# Patient Record
Sex: Male | Born: 1965 | Race: Black or African American | Hispanic: No | Smoking: Current some day smoker
Health system: Southern US, Community
[De-identification: ages and names within clinical notes are randomized; demographics above are authoritative.]

## PROBLEM LIST (undated history)

## (undated) ENCOUNTER — Emergency Department (HOSPITAL_COMMUNITY): Disposition: A | Discharge status: Home / Self Care | Payer: BLUE CROSS/BLUE SHIELD

## (undated) DIAGNOSIS — M5430 Sciatica, unspecified side: Secondary | ICD-10-CM

## (undated) HISTORY — PX: LEG SURGERY: SHX1003

---

## 2000-08-30 ENCOUNTER — Emergency Department (HOSPITAL_COMMUNITY): Admission: EM | Admit: 2000-08-30 | Discharge: 2000-08-30 | Payer: Self-pay | Admitting: Emergency Medicine

## 2002-06-21 ENCOUNTER — Encounter: Payer: Self-pay | Admitting: Emergency Medicine

## 2002-06-21 ENCOUNTER — Emergency Department (HOSPITAL_COMMUNITY): Admission: EM | Admit: 2002-06-21 | Discharge: 2002-06-21 | Payer: Self-pay | Admitting: Emergency Medicine

## 2002-09-04 ENCOUNTER — Encounter: Payer: Self-pay | Admitting: Emergency Medicine

## 2002-09-04 ENCOUNTER — Emergency Department (HOSPITAL_COMMUNITY): Admission: EM | Admit: 2002-09-04 | Discharge: 2002-09-04 | Payer: Self-pay | Admitting: Emergency Medicine

## 2005-04-30 ENCOUNTER — Emergency Department (HOSPITAL_COMMUNITY): Admission: EM | Admit: 2005-04-30 | Discharge: 2005-04-30 | Payer: Self-pay | Admitting: Family Medicine

## 2005-07-08 ENCOUNTER — Emergency Department (HOSPITAL_COMMUNITY): Admission: EM | Admit: 2005-07-08 | Discharge: 2005-07-08 | Payer: Self-pay | Admitting: Emergency Medicine

## 2006-12-12 ENCOUNTER — Emergency Department (HOSPITAL_COMMUNITY): Admission: EM | Admit: 2006-12-12 | Discharge: 2006-12-12 | Payer: Self-pay | Admitting: Family Medicine

## 2007-05-11 ENCOUNTER — Emergency Department (HOSPITAL_COMMUNITY): Admission: EM | Admit: 2007-05-11 | Discharge: 2007-05-11 | Payer: Self-pay | Admitting: Family Medicine

## 2007-05-26 ENCOUNTER — Emergency Department (HOSPITAL_COMMUNITY): Admission: EM | Admit: 2007-05-26 | Discharge: 2007-05-26 | Payer: Self-pay | Admitting: Family Medicine

## 2011-08-17 LAB — HERPES SIMPLEX VIRUS CULTURE: Culture: NOT DETECTED

## 2011-08-17 LAB — HIV ANTIBODY (ROUTINE TESTING W REFLEX): HIV: NONREACTIVE

## 2011-08-17 LAB — GC/CHLAMYDIA PROBE AMP, GENITAL
Chlamydia, DNA Probe: NEGATIVE
GC Probe Amp, Genital: NEGATIVE

## 2011-08-17 LAB — RPR: RPR Ser Ql: NONREACTIVE

## 2011-08-17 LAB — HSV 2 ANTIBODY, IGG: HSV 2 Glycoprotein G Ab, IgG: 0.07

## 2011-10-06 ENCOUNTER — Encounter: Payer: Self-pay | Admitting: Emergency Medicine

## 2011-10-06 ENCOUNTER — Emergency Department (INDEPENDENT_AMBULATORY_CARE_PROVIDER_SITE_OTHER)
Admission: EM | Admit: 2011-10-06 | Discharge: 2011-10-06 | Disposition: A | Discharge status: Home / Self Care | Payer: Self-pay | Source: Home / Self Care | Attending: Family Medicine | Admitting: Family Medicine

## 2011-10-06 ENCOUNTER — Emergency Department (INDEPENDENT_AMBULATORY_CARE_PROVIDER_SITE_OTHER): Payer: Self-pay

## 2011-10-06 DIAGNOSIS — J40 Bronchitis, not specified as acute or chronic: Secondary | ICD-10-CM

## 2011-10-06 MED ORDER — ALBUTEROL SULFATE HFA 108 (90 BASE) MCG/ACT IN AERS: 2.0000 | INHALATION_SPRAY | RESPIRATORY_TRACT | Status: DC | PRN

## 2011-10-06 MED ORDER — GUAIFENESIN-CODEINE 100-10 MG/5ML PO SYRP: 5.0000 mL | ORAL_SOLUTION | Freq: Four times a day (QID) | ORAL | Status: AC | PRN

## 2011-10-06 NOTE — ED Provider Notes (Signed)
History     CSN: [id number] Arrival date & time: 10/06/2011  9:51 AM   First MD Initiated Contact with Patient 10/06/11 1000      No chief complaint on file.   (Consider location/radiation/quality/duration/timing/severity/associated sxs/prior treatment) HPI Comments: Glen Morton presents for evaluation of productive cough, fever, chills, back pain since Sunday morning. He also reports dizziness.  Patient is a 45 y.o. male presenting with cough. The history is provided by the patient.  Cough This is a new problem. The current episode started more than 2 days ago. The problem occurs constantly. The cough is productive of sputum. The maximum temperature recorded prior to his arrival was 101 to 101.9 F. The fever has been present for 1 to 2 days. Associated symptoms include chest pain, chills, sweats and shortness of breath. He has tried cough syrup for the symptoms. The treatment provided no relief.    No past medical history on file.  No past surgical history on file.  No family history on file.  History  Substance Use Topics  . Smoking status: Not on file  . Smokeless tobacco: Not on file  . Alcohol Use: Not on file      Review of Systems  Constitutional: Positive for chills.  Eyes: Negative.   Respiratory: Positive for cough and shortness of breath.   Cardiovascular: Positive for chest pain.  Gastrointestinal: Negative.   Genitourinary: Negative.   Musculoskeletal: Negative.   Skin: Negative.   Neurological: Positive for dizziness.    Allergies  Review of patient's allergies indicates not on file.  Home Medications  No current outpatient prescriptions on file.  BP 125/81  Pulse 84  Temp(Src) 97.6 F (36.4 C) (Oral)  Resp 12  SpO2 100%  Physical Exam  Nursing note and vitals reviewed. Constitutional: He is oriented to person, place, and time. He appears well-developed and well-nourished.  HENT:  Head: Normocephalic and atraumatic.  Right Ear: Tympanic  membrane and external ear normal.  Left Ear: Tympanic membrane and external ear normal.  Mouth/Throat: Uvula is midline, oropharynx is clear and moist and mucous membranes are normal. No oropharyngeal exudate, posterior oropharyngeal edema or posterior oropharyngeal erythema.  Eyes: Conjunctivae and EOM are normal. Pupils are equal, round, and reactive to light.  Neck: Normal range of motion.  Cardiovascular: Normal rate and regular rhythm.   Pulmonary/Chest: Effort normal. He has no wheezes. He has rhonchi in the left middle field and the left lower field. He has no rales.  Neurological: He is alert and oriented to person, place, and time.  Skin: Skin is warm and dry.    ED Course  Procedures (including critical care time)  Labs Reviewed - No data to display No results found.   No diagnosis found.    MDM  Chest x-ray: no infiltrate        Richardo Priest, MD 10/06/11 1121

## 2011-10-06 NOTE — ED Notes (Signed)
See previous note

## 2012-04-27 ENCOUNTER — Emergency Department (HOSPITAL_COMMUNITY)
Admission: EM | Admit: 2012-04-27 | Discharge: 2012-04-27 | Disposition: A | Discharge status: Home / Self Care | Payer: Self-pay | Attending: Emergency Medicine | Admitting: Emergency Medicine

## 2012-04-27 DIAGNOSIS — R209 Unspecified disturbances of skin sensation: Secondary | ICD-10-CM | POA: Insufficient documentation

## 2012-04-27 DIAGNOSIS — R202 Paresthesia of skin: Secondary | ICD-10-CM

## 2012-04-27 MED ORDER — PREDNISONE 20 MG PO TABS: 60.0000 mg | ORAL_TABLET | Freq: Every day | ORAL | Status: AC

## 2012-04-27 NOTE — Discharge Instructions (Signed)
Take prednisone as prescribed and follow up with [facility] for further evaluation and outpatient testing. Return here with any worsening symptoms - pain, weakness of the leg or foot, swelling, fever.

## 2012-04-27 NOTE — ED Provider Notes (Signed)
History     CSN: [id number]  Arrival date & time 04/27/12  0806   First MD Initiated Contact with Patient 04/27/12 0813      No chief complaint on file.   (Consider location/radiation/quality/duration/timing/severity/associated sxs/prior treatment) Patient is a 46 y.o. male presenting with lower extremity pain. The history is provided by the patient.  Foot Pain This is a new problem. The current episode started in the past 7 days. The problem occurs constantly. The problem has been unchanged. Associated symptoms include numbness. Pertinent negatives include no fever or weakness. Associated symptoms comments: He reports numbness and tingling to medial right foot without pain, swelling, discoloration or injury. There is no weakness. No history of similar symptoms previously.. Nothing aggravates the symptoms.    No past medical history on file.  Past Surgical History  Procedure Date  . Leg surgery     No family history on file.  History  Substance Use Topics  . Smoking status: Former Games developer  . Smokeless tobacco: Not on file  . Alcohol Use: No      Review of Systems  Constitutional: Negative for fever.  Cardiovascular: Negative for leg swelling.  Musculoskeletal: Negative for back pain.  Neurological: Positive for numbness. Negative for weakness.    Allergies  Review of patient's allergies indicates no known allergies.  Home Medications   Current Outpatient Rx  Name Route Sig Dispense Refill  . VITAMIN C PO Oral Take 1 tablet by mouth daily.    . B-12 PO Oral Take 1 tablet by mouth daily.    Marland Kitchen ZINC PO Oral Take 1 tablet by mouth daily.      BP 120/80  Pulse 70  Temp 97.9 F (36.6 C) (Oral)  Resp 18  SpO2 100%  Physical Exam  Constitutional: He is oriented to person, place, and time. He appears well-developed and well-nourished. No distress.  Musculoskeletal: Normal range of motion.       No back tenderness.  Neurological: He is alert and oriented to  person, place, and time. He has normal reflexes.       Decreased sensation to light touch medial aspect right foot from mid-forefoot to distal lower leg. No swelling, discoloration, or tenderness. Pulses 2 +. Full strength without deficit of knee, ankle and all digits equally.  Skin: Skin is warm and dry. No erythema.    ED Course  Procedures (including critical care time)  Labs Reviewed - No data to display No results found.   No diagnosis found.  1. Paresthesias   MDM  No objective physical exam findings, subjective sensation changes only. Will use anti-inflammatories and refer to neurology to consider nerve conduction studies.        Rodena Medin, PA-C 04/27/12 [phone]

## 2012-04-28 NOTE — ED Provider Notes (Signed)
Medical screening examination/treatment/procedure(s) were performed by non-physician practitioner and as supervising physician I was immediately available for consultation/collaboration.   Bereket Gernert L Noela Brothers, MD 04/28/12 1926 

## 2013-05-12 ENCOUNTER — Encounter (HOSPITAL_COMMUNITY): Payer: Self-pay

## 2013-05-12 ENCOUNTER — Emergency Department (HOSPITAL_COMMUNITY)
Admission: EM | Admit: 2013-05-12 | Discharge: 2013-05-12 | Disposition: A | Discharge status: Home / Self Care | Payer: Self-pay | Attending: Emergency Medicine | Admitting: Emergency Medicine

## 2013-05-12 DIAGNOSIS — Y929 Unspecified place or not applicable: Secondary | ICD-10-CM | POA: Insufficient documentation

## 2013-05-12 DIAGNOSIS — Y9389 Activity, other specified: Secondary | ICD-10-CM | POA: Insufficient documentation

## 2013-05-12 DIAGNOSIS — Z87891 Personal history of nicotine dependence: Secondary | ICD-10-CM | POA: Insufficient documentation

## 2013-05-12 DIAGNOSIS — S39012A Strain of muscle, fascia and tendon of lower back, initial encounter: Secondary | ICD-10-CM

## 2013-05-12 DIAGNOSIS — S335XXA Sprain of ligaments of lumbar spine, initial encounter: Secondary | ICD-10-CM | POA: Insufficient documentation

## 2013-05-12 DIAGNOSIS — X503XXA Overexertion from repetitive movements, initial encounter: Secondary | ICD-10-CM | POA: Insufficient documentation

## 2013-05-12 DIAGNOSIS — Z79899 Other long term (current) drug therapy: Secondary | ICD-10-CM | POA: Insufficient documentation

## 2013-05-12 MED ORDER — HYDROCODONE-ACETAMINOPHEN 5-325 MG PO TABS
1.0000 | ORAL_TABLET | Freq: Once | ORAL | Status: AC
  Administered 2013-05-12: 1 via ORAL
  Filled 2013-05-12: qty 1

## 2013-05-12 MED ORDER — HYDROCODONE-ACETAMINOPHEN 5-325 MG PO TABS
1.0000 | ORAL_TABLET | Freq: Four times a day (QID) | ORAL | Status: DC | PRN
Start: 2013-05-12 — End: 2014-02-10

## 2013-05-12 MED ORDER — PREDNISONE 10 MG PO TABS: 20.0000 mg | ORAL_TABLET | Freq: Every day | ORAL | Status: DC

## 2013-05-12 MED ORDER — CYCLOBENZAPRINE HCL 10 MG PO TABS: 10.0000 mg | ORAL_TABLET | Freq: Two times a day (BID) | ORAL | Status: DC | PRN

## 2013-05-12 NOTE — ED Notes (Signed)
Pt presents with 1 week h/o low back pain.  Pt reports pain began after moving a transformer, with pain increasing today when he had to run from a dog.  Pt reports pain radiates down R leg, pt denies any urinary or fecal incontinence.

## 2013-05-12 NOTE — ED Provider Notes (Signed)
History    CSN: [id number] Arrival date & time 05/12/13  1137  First MD Initiated Contact with Patient 05/12/13 1245     No chief complaint on file.  (Consider location/radiation/quality/duration/timing/severity/associated sxs/prior Treatment) HPI  Glen Morton is a 47 y.o.male without any significant PMH presents to the ER with complaints of right-sided low back pain. It started one week ago after he was moving something heavy and never went away. Today he was chased by a dog and afterwards his pain is significantly worse. He says he does not want to stand up straight because of the pain. It is non-the midline but it radiates down his right leg. He has not had any bowel or urine incontinence. Says he feels some tingling in his thigh. He did not fall and has not had any other associated symptoms with it.  History reviewed. No pertinent past medical history. Past Surgical History  Procedure Laterality Date  . Leg surgery     History reviewed. No pertinent family history. History  Substance Use Topics  . Smoking status: Former Games developer  . Smokeless tobacco: Not on file  . Alcohol Use: No    Review of Systems  Musculoskeletal: Positive for back pain.  All other systems reviewed and are negative.    Allergies  Review of patient's allergies indicates no known allergies.  Home Medications   Current Outpatient Rx  Name  Route  Sig  Dispense  Refill  . Acetaminophen (TYLENOL PO)   Oral   Take 2 tablets by mouth once.         . cyclobenzaprine (FLEXERIL) 10 MG tablet   Oral   Take 1 tablet (10 mg total) by mouth 2 (two) times daily as needed for muscle spasms.   20 tablet   0   . HYDROcodone-acetaminophen (NORCO/VICODIN) 5-325 MG per tablet   Oral   Take 1 tablet by mouth every 6 (six) hours as needed for pain.   20 tablet   0   . predniSONE (DELTASONE) 10 MG tablet   Oral   Take 2 tablets (20 mg total) by mouth daily.   15 tablet   0    BP 129/77  Pulse 80   Temp(Src) 98.6 F (37 C) (Oral)  Resp 96  SpO2 97% Physical Exam  Nursing note and vitals reviewed. Constitutional: He appears well-developed and well-nourished. No distress.  HENT:  Head: Normocephalic and atraumatic.  Eyes: Pupils are equal, round, and reactive to light.  Neck: Normal range of motion. Neck supple.  Cardiovascular: Normal rate and regular rhythm.   Pulmonary/Chest: Effort normal.  Abdominal: Soft.  Musculoskeletal:       Back:   Equal strength to bilateral lower extremities. Neurosensory function adequate to both legs. Skin color is normal. Skin is warm and moist. I see no step off deformity, no bony tenderness. Pt is able to ambulate without limp. Pain is relieved when sitting in certain positions. ROM is decreased due to pain. No crepitus, laceration, effusion, swelling.  Pulses are normal   Neurological: He is alert.  Skin: Skin is warm and dry.    ED Course  Procedures (including critical care time) Labs Reviewed - No data to display No results found. 1. Back strain, initial encounter     MDM  Patient with back pain. No neurological deficits. Patient is ambulatory. No warning symptoms of back pain including: loss of bowel or bladder control, night sweats, waking from sleep with back pain, unexplained fevers or weight loss,  h/o cancer, IVDU, recent significant  trauma. No concern for cauda equina, epidural abscess, or other serious cause of back pain. Conservative measures such as rest, ice/heat and pain medicine indicated with PCP follow-up if no improvement with conservative management.     Dorthula Matas, PA-C 05/12/13 1314

## 2013-05-16 NOTE — ED Provider Notes (Signed)
Medical screening examination/treatment/procedure(s) were performed by non-physician practitioner and as supervising physician I was immediately available for consultation/collaboration.    Gilda Crease, MD 05/16/13 [phone]

## 2014-02-10 ENCOUNTER — Encounter (HOSPITAL_COMMUNITY): Payer: Self-pay | Admitting: Emergency Medicine

## 2014-02-10 ENCOUNTER — Emergency Department (INDEPENDENT_AMBULATORY_CARE_PROVIDER_SITE_OTHER)
Admission: EM | Admit: 2014-02-10 | Discharge: 2014-02-10 | Disposition: A | Discharge status: Home / Self Care | Payer: Self-pay | Source: Home / Self Care | Attending: Family Medicine | Admitting: Family Medicine

## 2014-02-10 DIAGNOSIS — K0889 Other specified disorders of teeth and supporting structures: Secondary | ICD-10-CM

## 2014-02-10 DIAGNOSIS — K089 Disorder of teeth and supporting structures, unspecified: Secondary | ICD-10-CM

## 2014-02-10 MED ORDER — TRAMADOL HCL 50 MG PO TABS: 50.0000 mg | ORAL_TABLET | Freq: Four times a day (QID) | ORAL | Status: DC | PRN

## 2014-02-10 MED ORDER — AMOXICILLIN 500 MG PO CAPS: 500.0000 mg | ORAL_CAPSULE | Freq: Three times a day (TID) | ORAL | Status: DC

## 2014-02-10 NOTE — ED Provider Notes (Signed)
Glen Morton is a 48 y.o. male who presents to Urgent Care today for  Left ear and teeth pain. Patient has a five-day history of left upper tooth pain radiating to the left ear. He applied a salt compresses which seem to help his cheek some. He denies any fevers chills nausea vomiting or diarrhea.   History reviewed. No pertinent past medical history. History  Substance Use Topics  . Smoking status: Current Some Day Smoker    Types: Cigarettes  . Smokeless tobacco: Not on file  . Alcohol Use: No   ROS as above Medications: No current facility-administered medications for this encounter.   Current Outpatient Prescriptions  Medication Sig Dispense Refill  . Acetaminophen (TYLENOL PO) Take 2 tablets by mouth once.      Marland Kitchen. amoxicillin (AMOXIL) 500 MG capsule Take 1 capsule (500 mg total) by mouth 3 (three) times daily.  30 capsule  0  . traMADol (ULTRAM) 50 MG tablet Take 1 tablet (50 mg total) by mouth every 6 (six) hours as needed.  15 tablet  0    Exam:  BP 128/82  Pulse 66  Temp(Src) 98.1 F (36.7 C) (Oral)  Resp 14  SpO2 100% Gen: Well NAD HEENT: EOMI,  MMM upper left wisdom tooth eroded to gum with gumline erythema and tenderness. Tympanic membranes are normal appearing bilaterally Lungs: Normal work of breathing. CTABL Heart: RRR no MRG Abd: NABS, Soft. NT, ND Exts: Brisk capillary refill, warm and well perfused.    Assessment and Plan: 48 y.o. male with dental pain radiating to the left ear. Secondary to infection. Plan to treat with amoxicillin and tramadol. Refer to dentist.  Discussed warning signs or symptoms. Please see discharge instructions. Patient expresses understanding.    Rodolph BongEvan S Elsworth Ledin, MD 02/10/14 1248

## 2014-02-10 NOTE — Discharge Instructions (Signed)
Thank you for coming in today. Take amoxicillin 3 times daily Take 2 Aleve twice daily for pain Use tramadol for severe pain Followup with a dentist as soon as possible  Please call Dr. Lawrence Marseillesivils office [phone] or cell [phone] [address], [place]  Cost for tooth removal $200 includes exam, Xray, and extraction and follow up visit.  Bring list of current medications with you.   Please call [facility] at [phone] to get the details to get your tooth pulled.    Dental Pain A tooth ache may be caused by cavities (tooth decay). Cavities expose the nerve of the tooth to air and hot or cold temperatures. It may come from an infection or abscess (also called a boil or furuncle) around your tooth. It is also often caused by dental caries (tooth decay). This causes the pain you are having. DIAGNOSIS  Your caregiver can diagnose this problem by exam. TREATMENT   If caused by an infection, it may be treated with medications which kill germs (antibiotics) and pain medications as prescribed by your caregiver. Take medications as directed.  Only take over-the-counter or prescription medicines for pain, discomfort, or fever as directed by your caregiver.  Whether the tooth ache today is caused by infection or dental disease, you should see your dentist as soon as possible for further care. SEEK MEDICAL CARE IF: The exam and treatment you received today has been provided on an emergency basis only. This is not a substitute for complete medical or dental care. If your problem worsens or new problems (symptoms) appear, and you are unable to meet with your dentist, call or return to this location. SEEK IMMEDIATE MEDICAL CARE IF:   You have a fever.  You develop redness and swelling of your face, jaw, or neck.  You are unable to open your mouth.  You have severe pain uncontrolled by pain medicine. MAKE SURE YOU:   Understand these instructions.  Will watch your  condition.  Will get help right away if you are not doing well or get worse. Document Released: 10/19/2005 Document Revised: 01/11/2012 Document Reviewed: 06/06/2008 [facility] Patient Information 2014 [employer] [place].

## 2014-02-10 NOTE — ED Notes (Signed)
Pt had an upper left toothache about a week ago, not that pain has resolved.  The next day --2 days ago-- his left ear started hurting.  He denies discharge

## 2014-03-17 ENCOUNTER — Emergency Department (HOSPITAL_COMMUNITY): Payer: Self-pay

## 2014-03-17 ENCOUNTER — Emergency Department (HOSPITAL_COMMUNITY)
Admission: EM | Admit: 2014-03-17 | Discharge: 2014-03-17 | Disposition: A | Discharge status: Home / Self Care | Payer: Self-pay | Attending: Emergency Medicine | Admitting: Emergency Medicine

## 2014-03-17 ENCOUNTER — Encounter (HOSPITAL_COMMUNITY): Payer: Self-pay | Admitting: Emergency Medicine

## 2014-03-17 DIAGNOSIS — F17200 Nicotine dependence, unspecified, uncomplicated: Secondary | ICD-10-CM | POA: Insufficient documentation

## 2014-03-17 DIAGNOSIS — M25569 Pain in unspecified knee: Secondary | ICD-10-CM | POA: Insufficient documentation

## 2014-03-17 DIAGNOSIS — Z792 Long term (current) use of antibiotics: Secondary | ICD-10-CM | POA: Insufficient documentation

## 2014-03-17 DIAGNOSIS — M25561 Pain in right knee: Secondary | ICD-10-CM

## 2014-03-17 MED ORDER — NAPROXEN 500 MG PO TABS: 500.0000 mg | ORAL_TABLET | Freq: Two times a day (BID) | ORAL | Status: DC

## 2014-03-17 NOTE — ED Provider Notes (Signed)
CSN: [id number]     Arrival date & time 03/17/14  1640 History   This chart was scribed for  Emilia BeckKaitlyn Youssef Footman, PA by Evon Slackerrance Branch, ED Scribe. This patient was seen in room TR08C/TR08C and the patient's care was started at 7:22 PM.    Chief Complaint  Patient presents with  . Knee Pain   Patient is a 48 y.o. male presenting with knee pain. The history is provided by the patient. No language interpreter was used.  Knee Pain Location:  Knee Injury: no   Knee location:  R knee Pain details:    Radiates to:  Does not radiate   Severity:  Mild   Onset quality:  Sudden   Duration:  10 hours   Timing:  Constant   Progression:  Unchanged Chronicity:  New Dislocation: no   Prior injury to area:  No  HPI Comments: Glen Morton is a 48 y.o. male who presents to the Emergency Department complaining of right knee pain onset this morning. He states that he only noticed that his knee was swollen when he woke up this morning.  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 He states that he also is on his knees for extended period of times.  He states that he is having issues when ambulating. He denies any other related symptoms.    History reviewed. No pertinent past medical history. Past Surgical History  Procedure Laterality Date  . Leg surgery     History reviewed. No pertinent family history. History  Substance Use Topics  . Smoking status: Current Some Day Smoker    Types: Cigarettes  . Smokeless tobacco: Not on file  . Alcohol Use: Yes    Review of Systems  Musculoskeletal: Positive for arthralgias.       Right knee pain  All other systems reviewed and are negative.     Allergies  Review of patient's allergies indicates no known allergies.  Home Medications   Prior to Admission medications   Medication Sig Start Date End Date Taking? Authorizing Provider  Acetaminophen (TYLENOL PO) Take 2 tablets by mouth once.    Historical Provider, MD   amoxicillin (AMOXIL) 500 MG capsule Take 1 capsule (500 mg total) by mouth 3 (three) times daily. 02/10/14   Rodolph BongEvan S Corey, MD  traMADol (ULTRAM) 50 MG tablet Take 1 tablet (50 mg total) by mouth every 6 (six) hours as needed. 02/10/14   Rodolph BongEvan S Corey, MD   Triage Vitals: BP 116/72  Pulse 98  Temp(Src) 98.9 F (37.2 C) (Oral)  Resp 14  Ht 6\' 2"  (1.88 m)  Wt 188 lb 4 oz (85.39 kg)  BMI 24.16 kg/m2  SpO2 98%  Physical Exam  Nursing note and vitals reviewed. Constitutional: He is oriented to person, place, and time. He appears well-developed and well-nourished. No distress.  HENT:  Head: Normocephalic and atraumatic.  Eyes: EOM are normal.  Neck: Neck supple. No tracheal deviation present.  Cardiovascular: Normal rate and intact distal pulses.   Pulmonary/Chest: Effort normal. No respiratory distress.  Musculoskeletal: Normal range of motion.  Mild anterior right knee edema, medial right knee tenderness to palpation, no obvious deformity, Full ROM.  Neurological: He is alert and oriented to person, place, and time.  Skin: Skin is warm and dry.  Psychiatric: He has a normal mood and affect. His behavior is normal.    ED Course  Procedures (including critical care time) DIAGNOSTIC STUDIES: Oxygen Saturation is 98%  on RA, normal by my interpretation.    COORDINATION OF CARE: 7:27 PM-Discussed treatment plan which includes anit-inflammatory medication and knee sleeve with pt at bedside and pt agreed to plan.   SPLINT APPLICATION Date/Time: 7:49 PM Authorized by: Emilia BeckKaitlyn Keymani Mclean Consent: Verbal consent obtained. Risks and benefits: risks, benefits and alternatives were discussed Consent given by: patient Splint applied by: nurse Location details: right knee Splint type: knee sleeve Supplies used: knee sleeve Post-procedure: The splinted body part was neurovascularly unchanged following the procedure. Patient tolerance: Patient tolerated the procedure well with no immediate  complications.     Labs Review Labs Reviewed - No data to display  Imaging Review Dg Knee Complete 4 Views Right  03/17/2014   CLINICAL DATA:  Retropatellar right knee pain without history of recent trauma ; history of right femur fracture with surgery in 1989  EXAM: RIGHT KNEE - COMPLETE 4+ VIEW  COMPARISON:  None.  FINDINGS: Four views of the right knee reveal the bones to be reasonably well mineralized for age. There is mild beaking of the tibial spines. The joint spaces are well maintained on this nonweightbearing series. There is no evidence of a joint effusion. The patella appears to be appropriately positioned. There is an intra medullary rod in the femoral shaft.  IMPRESSION: There is no acute bony abnormality of the right knee. Mild degenerative change is present.   Electronically Signed   By: David  [place]azil[place]   On: 03/17/2014 19:06     EKG Interpretation None      MDM   Final diagnoses:  Right knee pain   7:50 PM Xray unremarkable for acute changes. Patient may have tendinitis and/or prepatellar bursitis. Patient will have a knee sleeve and be discharged with Naprosyn. Patient advised to follow up with Orthopedic surgeon if symptoms do not resolve.   I personally performed the services described in this documentation, which was scribed in my presence. The recorded information has been reviewed and is accurate.      Emilia BeckKaitlyn Ardeth Repetto, PA-C 03/17/14 1950

## 2014-03-17 NOTE — Discharge Instructions (Signed)
Wear your knee sleeve as needed for support. Take naprosyn as needed for pain. Rest, ice and elevate your knee. Follow up with Dr. August Saucerean if pain persists.

## 2014-03-17 NOTE — ED Notes (Addendum)
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 Cms intact, ambulatory.

## 2014-03-18 NOTE — ED Provider Notes (Signed)
Medical screening examination/treatment/procedure(s) were performed by non-physician practitioner and as supervising physician I was immediately available for consultation/collaboration.   EKG Interpretation None       Jill Ruppe R. Tyrie Porzio, MD 03/18/14 0031 

## 2014-06-01 ENCOUNTER — Emergency Department (HOSPITAL_COMMUNITY)
Admission: EM | Admit: 2014-06-01 | Discharge: 2014-06-01 | Disposition: A | Discharge status: Home / Self Care | Payer: Self-pay | Attending: Emergency Medicine | Admitting: Emergency Medicine

## 2014-06-01 ENCOUNTER — Encounter (HOSPITAL_COMMUNITY): Payer: Self-pay | Admitting: Emergency Medicine

## 2014-06-01 DIAGNOSIS — S39012A Strain of muscle, fascia and tendon of lower back, initial encounter: Secondary | ICD-10-CM

## 2014-06-01 DIAGNOSIS — Y9389 Activity, other specified: Secondary | ICD-10-CM | POA: Insufficient documentation

## 2014-06-01 DIAGNOSIS — F17200 Nicotine dependence, unspecified, uncomplicated: Secondary | ICD-10-CM | POA: Insufficient documentation

## 2014-06-01 DIAGNOSIS — S335XXA Sprain of ligaments of lumbar spine, initial encounter: Secondary | ICD-10-CM | POA: Insufficient documentation

## 2014-06-01 DIAGNOSIS — IMO0002 Reserved for concepts with insufficient information to code with codable children: Secondary | ICD-10-CM | POA: Insufficient documentation

## 2014-06-01 DIAGNOSIS — Y929 Unspecified place or not applicable: Secondary | ICD-10-CM | POA: Insufficient documentation

## 2014-06-01 DIAGNOSIS — X500XXA Overexertion from strenuous movement or load, initial encounter: Secondary | ICD-10-CM | POA: Insufficient documentation

## 2014-06-01 MED ORDER — CYCLOBENZAPRINE HCL 10 MG PO TABS
10.0000 mg | ORAL_TABLET | Freq: Once | ORAL | Status: AC
  Administered 2014-06-01: 10 mg via ORAL
  Filled 2014-06-01: qty 1

## 2014-06-01 MED ORDER — CYCLOBENZAPRINE HCL 10 MG PO TABS: 10.0000 mg | ORAL_TABLET | Freq: Two times a day (BID) | ORAL | Status: DC | PRN

## 2014-06-01 NOTE — ED Provider Notes (Signed)
CSN: [id number]     Arrival date & time 06/01/14  0926 History  This chart was scribed for non-physician practitioner, Glen BeckKaitlyn Byanka Landrus, PA-C working with Glen ArgyleForrest S Harrison, MD by Glen Morton, ED scribe. This patient was seen in room TR07C/TR07C and the patient's care was started at 10:23 AM.    Chief Complaint  Patient presents with  . Back Pain   The history is provided by the patient. No language interpreter was used.   HPI Comments: Glen Morton is a 48 y.o. male who presents to the Emergency Department complaining of gradual onset lower back pain that started 2 days ago when he woke up. Denies injury. States he bent over to pick up his keys later that day and couldn't stand up straight. Denies radiation of pain. Leaning over worsens the pain. Pt has not done anything for his symptoms. Denies trouble walking.   History reviewed. No pertinent past medical history. Past Surgical History  Procedure Laterality Date  . Leg surgery     No family history on file. History  Substance Use Topics  . Smoking status: Current Some Day Smoker    Types: Cigarettes  . Smokeless tobacco: Not on file  . Alcohol Use: No    Review of Systems  Musculoskeletal: Positive for back pain. Negative for gait problem.  All other systems reviewed and are negative.  Allergies  Review of patient's allergies indicates no known allergies.  Home Medications   Prior to Admission medications   Not on File   BP 117/75  Pulse 57  Temp(Src) 98.3 F (36.8 C) (Oral)  Resp 18  Ht 6\' 2"  (1.88 m)  Wt 190 lb (86.183 kg)  BMI 24.38 kg/m2  SpO2 100%  Physical Exam  Nursing note and vitals reviewed. Constitutional: He is oriented to person, place, and time. He appears well-developed and well-nourished. No distress.  HENT:  Head: Normocephalic and atraumatic.  Eyes: Conjunctivae and EOM are normal.  Neck: Neck supple. No tracheal deviation present.  Cardiovascular: Normal rate.   Pulmonary/Chest: Effort  normal. No respiratory distress.  Musculoskeletal: Normal range of motion.  No midline spine tenderness. Bilateral lumbar paraspinal tenderness to palpation.   Neurological: He is alert and oriented to person, place, and time.  Lower extremity strength and sensation intact.  Skin: Skin is warm and dry.  Psychiatric: He has a normal mood and affect. His behavior is normal.    ED Course  Procedures (including critical care time)  DIAGNOSTIC STUDIES: Oxygen Saturation is 100% on RA, normal by my interpretation.    COORDINATION OF CARE: 10:25 AM-Discussed treatment plan which includes a muscle relaxer and heating pad with pt at bedside and pt agreed to plan.   Labs Review Labs Reviewed - No data to display  Imaging Review No results found.   EKG Interpretation None      MDM   Final diagnoses:  Lumbar strain, initial encounter    Patient has a lumbar strain from injury that occurred earlier this week. No bladder/bowel incontinence or saddle paresthesias. No imaging needed at this time. Patient will have flexeril for pain.   I personally performed the services described in this documentation, which was scribed in my presence. The recorded information has been reviewed and is accurate.  Glen BeckKaitlyn Treylan Mcclintock, PA-C 06/01/14 1623

## 2014-06-01 NOTE — Discharge Instructions (Signed)
Take Flexeril as needed for muscle strain. Apply heat to your back for symptom relief. Refer to attached documents for more information. Return to the ED with worsening or concerning symptoms.

## 2014-06-01 NOTE — ED Notes (Signed)
Patient states started having lower back pain on Wednesday.   Patient denies any other symptoms.   Patient states he dropped keys and bent to get them, came back up and couldn't straighten up all the way on Wednesday.   Patient states has not taken anything for pain.

## 2014-06-01 NOTE — ED Provider Notes (Signed)
Medical screening examination/treatment/procedure(s) were performed by non-physician practitioner and as supervising physician I was immediately available for consultation/collaboration.   EKG Interpretation None       Junius ArgyleForrest S Jaquan Sadowsky, MD 06/01/14 [phone]

## 2014-07-25 ENCOUNTER — Encounter (HOSPITAL_COMMUNITY): Payer: Self-pay | Admitting: Emergency Medicine

## 2014-07-25 ENCOUNTER — Emergency Department (HOSPITAL_COMMUNITY)
Admission: EM | Admit: 2014-07-25 | Discharge: 2014-07-25 | Disposition: A | Discharge status: Home / Self Care | Payer: Self-pay | Attending: Emergency Medicine | Admitting: Emergency Medicine

## 2014-07-25 DIAGNOSIS — F17200 Nicotine dependence, unspecified, uncomplicated: Secondary | ICD-10-CM | POA: Insufficient documentation

## 2014-07-25 DIAGNOSIS — M542 Cervicalgia: Secondary | ICD-10-CM | POA: Insufficient documentation

## 2014-07-25 DIAGNOSIS — M549 Dorsalgia, unspecified: Secondary | ICD-10-CM | POA: Insufficient documentation

## 2014-07-25 HISTORY — DX: Sciatica, unspecified side: M54.30

## 2014-07-25 MED ORDER — METHOCARBAMOL 500 MG PO TABS: 500.0000 mg | ORAL_TABLET | Freq: Two times a day (BID) | ORAL | Status: DC

## 2014-07-25 MED ORDER — PREDNISONE 20 MG PO TABS: 40.0000 mg | ORAL_TABLET | Freq: Every day | ORAL | Status: DC

## 2014-07-25 NOTE — Progress Notes (Signed)
  CARE MANAGEMENT ED NOTE 07/25/2014  Patient:  Glen Morton, Glen Morton   Account Number:  [id number]  Date Initiated:  07/25/2014  Documentation initiated by:  Ferdinand Cava  Subjective/Objective Assessment:   48 yo male presenting to the ED with c/o pain     Subjective/Objective Assessment Detail:     Action/Plan:   Provided the patient with a resource guide for the uninsured but encouraged him once her receives insurance to work with [employer] for an [employer] provider.   Action/Plan Detail:   Anticipated DC Date:       Status Recommendation to Physician:   Result of Recommendation:  Agreed    DC Planning Services  CM consult  PCP issues    Choice offered to / List presented to:  C-1 Patient          Status of service:  Completed, signed off  ED Comments:   ED Comments Detail:  This CM spoke with the patient regarding no PCP or insurance. The patient stated that he just obtained insurance through his job but he does not have his card yet, he is waiting to receive his card. This CM spoke with the patient regarding this ED visit and the patient stated that the pain is job related. This CM discussed the importance of a PCP and to work with his insurance to find an in network provider but this CM also provided the patient with a Pharmacist, hospital for the uninsured in [place]. Discussed chronic pain and if that is the case he will need a PCP to refer him to a pain clinic if found appropriate. Explained that the ED cannot manage chronic conditions and importance of provider. Patient verbalized understanding and had no other questions or concerns.

## 2014-07-25 NOTE — Discharge Instructions (Signed)
Take the prescribed medication as directed. Follow-up with a primary care physician in the area for re-check. Return to the ED for new or worsening symptoms.   Emergency Department Resource Guide 1) Find a Doctor and Pay Out of Pocket Although you won't have to find out who is covered by your insurance plan, it is a good idea to ask around and get recommendations. You will then need to call the office and see if the doctor you have chosen will accept you as a new patient and what types of options they offer for patients who are self-pay. Some doctors offer discounts or will set up payment plans for their patients who do not have insurance, but you will need to ask so you aren't surprised when you get to your appointment.  2) Contact Your Local Health Department Not all health departments have doctors that can see patients for sick visits, but many do, so it is worth a call to see if yours does. If you don't know where your local health department is, you can check in your phone book. The CDC also has a tool to help you locate your state's health department, and many state websites also have listings of all of their local health departments.  3) Find a Walk-in Clinic If your illness is not likely to be very severe or complicated, you may want to try a walk in clinic. These are popping up all over the country in pharmacies, drugstores, and shopping centers. They're usually staffed by nurse practitioners or physician assistants that have been trained to treat common illnesses and complaints. They're usually fairly quick and inexpensive. However, if you have serious medical issues or chronic medical problems, these are probably not your best option.  No Primary Care Doctor: - Call Health Connect at  [phone] - they can help you locate a primary care doctor that  accepts your insurance, provides certain services, etc. - Physician Referral Service- [phone]  Chronic Pain Problems: Organization          Address  Phone   Notes  [facility]  [phone] Patients need to be referred by their primary care doctor.   Medication Assistance: Organization         Address  Phone   Notes  [facility] Medication [facility] [address] [place] [place], [place] [address] [phone] --Must be a resident of [facility] -- Must have NO insurance coverage whatsoever (no Medicaid/ Medicare, etc.) -- The pt. MUST have a primary care doctor that directs their care regularly and follows them in the community   MedAssist  [phone]   [employer]  [phone]    Agencies that provide inexpensive medical care: Organization         Address  Phone   Notes  [facility]  [phone]   [facility]    [phone]   [facility] [address] [place], [place] [address] [phone]   [facility] [address] [place], [place] [phone]   Planned Parenthood    [phone]   [facility]    [phone]   [facility] and [facility]  [address], [place] Phone:  [phone], Fax:  [phone] Hours of Operation:  9 am - 6 pm, M-F.  Also accepts Medicaid/Medicare and self-pay.  [facility]  [address],  [place] Phone: [phone](336) [phone], Fax: [phone](336) [phone]. Hours of Operation:  8:30 am - 5:30 pm, M-F.  Also accepts Medicaid and self-pay.  [facility] [place] [address], [place] Phone: [phone]) [phone]   [facility] [address], [place], [place] [phone], Ext. [phone] Mondays & Thursdays: 7-9 AM.  First 15 patients are seen on a first come, first serve basis.    Medicaid-accepting [facility] Providers:  Organization         Address  Phone   Notes  [facility] [address], [place] [phone]) [phone] Also accepts self-pay patients.    [facility] [address], [place]  [phone]) [phone]   [facility] [address], [place] [phone](336) [phone]   [facility] [address] [place] [address], [place] [phone]) [phone]   Renaye RakersVeita Bland [address], [place]   [phone] Only accepts [place] Access [place]icaid patients after they have their name applied to their card.   Self-Pay (no insurance) in [facility]:  Organization         Address  Phone   Notes  Sickle Cell Patients, [facility] [address], [place] [phone](336) [phone]   [facility] [address], [place] [phone]) [phone]   [facility]  [address], [place] [phone]   [facility]/Dr. Osei-Bonsu  [address] [place] [address], [place] or [phone], [place] [phone] Phone number for both [place] and [place] locations is the same.  [facility] [address], [place] [phone]) [phone]   [facility] [place] [address] [place] [address], [place] or [address] [phone]   [facility] [address], [place] [phone]) [phone], phone; [phone]) [phone], fax Sees patients 1st and 3rd Saturday of every month.  Must not qualify for public or private insurance (i.e. Medicaid, Medicare, [place] Health Choice, Veterans' Benefits)  Household income should be no more than 200% of the poverty level The clinic cannot treat you if you are pregnant or think you are pregnant  Sexually transmitted diseases are not treated at the clinic.    Dental Care: Organization         Address  Phone  Notes  [facility] of [facility] [address], [place] [phone] Accepts children up to age 46 who are enrolled in [place]icaid or [place] Health Choice; pregnant women with a Medicaid  card; and children who have applied for Medicaid or [place] Health Choice, but were declined, whose parents can pay a reduced fee at time of service.  [facility] of [facility] [place]  [address], [place] [phone]) [phone] Accepts children up to age 10821 who are enrolled in [place]icaid or [place] Health Choice; pregnant women with a Medicaid card; and children who have applied for Medicaid or [place] Health Choice, but were declined, whose parents can pay a reduced fee at time of service.  [facility]  [address], [place] [phone] Patients are seen by appointment only. Walk-ins are not accepted. [facility] will see patients 46 years of age and older. Monday - Tuesday (8am-5pm) Most Wednesdays (8:30-5pm) $30 per visit, cash only  [facility]  [address], [facility] [phone]) [phone] Patients are seen by appointment only. Walk-ins are not accepted. [facility] will see patients 46 years of age and older. One Wednesday Evening (Monthly: Volunteer Based).  $30 per visit, cash only  [employer] [facility] [facility]  [phone]) [phone] for adults; Children under age 724, call Graduate Pediatric Dentistry at [phone]. Children aged 244-14, please call [phone] to request a pediatric application.  Dental services are provided in all areas of dental care including fillings, crowns and bridges, complete and partial dentures, implants, gum treatment, root canals, and extractions. Preventive care is also provided. Treatment is provided to both adults and children. Patients are selected via a lottery and there is often a waiting list.   [facility] [address], [place]  [phone]) [phone] [email or web address]   [facility] [address], [place], [place] [phone], Ext. [phone] Second and Fourth Thursday of each month, opens at 6:30 AM; Clinic ends at 9 AM.  Patients are seen on a first-come  first-served basis, and a limited number are seen during each clinic.   [facility]  [address], [place], [place] [phone]) [phone]   Eligibility Requirements You must have lived in [place], [place], or [place] counties for at least the last three months.   You cannot be eligible for state or federal sponsored National Cityhealthcare insurance, including CIGNAVeterans Administration, [place]icaid, or [employer] EntertainmentMedicare.   You generally cannot be eligible for healthcare insurance through your employer.    How to apply: Eligibility screenings are held every Tuesday and Wednesday afternoon from 1:00 pm until 4:00 pm. You do not need an appointment for the interview!  [facility] [address], [place], [place] [phone]   [facility]  [phone]   [facility]  [phone]   [facility]  [phone]    Behavioral Health Resources in the Community: Intensive Outpatient Programs Organization         Address  Phone  Notes  [facility] [address], [place], [place] [phone]   [facility] [address], [place], [place] [phone]   [facility] [address], [place], [place]  [phone]   [facility] [address],  [place], [place] [phone] or [phone]   Substance Abuse Resources Organization         Address  Phone  Notes  [facility] Drug Services  [phone]   [facility]  [phone]   [facility]  [phone]   [facility]  [phone]   Residential & Outpatient Substance Abuse Program  [phone]   Psychological Services Organization         Address  Phone  Notes  [facility]  [phone]- [phone]   [facility]  [phone]- [phone]   [facility] [address], [place] [phone] or [phone]    Mobile Crisis Teams Organization          Address  Phone  Notes  [facility], Mobile Crisis Care Unit  [phone]   [facility]  [address] [place], [place] [phone]   Doristine LocksSharon DeEsch [address] [place] [phone]    Self-Help/Support Groups Organization         Address  Phone             Notes  Mental Health Assoc. of [place] -  variety of support groups  [phone] Call for more information  Narcotics Anonymous (NA), Caring Services [address], [place]  2 meetings at this location   Residential Facilities manager         Address  Phone  Notes  [facility] [place],    [place]  [phone]   [facility]  [address] [place] [phone], [place], [place]   [place], [place] [phone] Admissions: 8am-3pm M-F  Incentives Substance [place] [address]    [place], [place] [phone]   [facility] [address] [place], [place], [place]   [facility] [address]  [place], [place]   [facility] Programs - Intensive Outpatient [address], [place], [place]   [facility] [address].,  [place], [place] [phone] or [phone]   [facility] (RTS) [address], [place], [place] Accepts Medicaid  [facility] [place] [address],  [place] [phone] Substance Abuse/Addiction Treatment   [facility] Organization         Address  Phone  Notes  [facility]  [phone]   Domenic Schwab, PhD [address] [place], [place]   [phone] or [phone]   [place] [address] [place], [place] [phone]   [facility] [address] [place], [place] [phone] Insurance/Medicaid/sponsorship  through [facility] and Families [address] [place]                                    [place], [place] [phone] [place] [address] [place], [place] [phone]    Dr. Adele Schilder  [phone]   [facility] Dept. 1) [address]  2) [place] 3)  [place] [address], [place] [phone]  [phone]   [place] [phone] or [phone] (After Hours)

## 2014-07-25 NOTE — ED Notes (Signed)
Pt woke with neck pain radiating across shoulders 2 days ago. Later started having pain radiating down back and posterior aspect of both legs. Denies numbness or tingling. Hx of sciatica in the past.

## 2014-07-25 NOTE — ED Provider Notes (Signed)
CSN: [id number]     Arrival date & time 07/25/14  4098 History  This chart was scribed for non-physician practitioner, Sharilyn Sites, PA-C working with Samuel Jester, DO by Greggory Stallion, ED scribe. This patient was seen in room TR05C/TR05C and the patient's care was started at 9:22 AM.   Chief Complaint  Patient presents with  . Back Pain   The history is provided by the patient. No language interpreter was used.   HPI Comments: Glen Morton is a 48 y.o. male with history of sciatica who presents to the Emergency Department complaining of neck pain that radiates into bilateral shoulders that started 2 days ago when he woke up. Pain started worsening and radiating down his back and into posterior legs. Denies recent fall or injury. Ambulation relieves some pain but sitting and laying down worsen it. He has taken ibuprofen and a left over pain medication with no relief. Unsure what the medication was. Denies fever, chest pain, bowel or bladder incontinence, trouble walking, numbness, weakness. Denies history of back surgeries.   Past Medical History  Diagnosis Date  . Sciatica    Past Surgical History  Procedure Laterality Date  . Leg surgery     History reviewed. No pertinent family history. History  Substance Use Topics  . Smoking status: Current Some Day Smoker    Types: Cigarettes  . Smokeless tobacco: Not on file  . Alcohol Use: No    Review of Systems  Constitutional: Negative for fever.  Cardiovascular: Negative for chest pain.  Genitourinary:       Negative for bowel or bladder incontinence.  Musculoskeletal: Positive for back pain, myalgias and neck pain. Negative for gait problem.  Neurological: Negative for weakness and numbness.  All other systems reviewed and are negative.  Allergies  Review of patient's allergies indicates no known allergies.  Home Medications   Prior to Admission medications   Medication Sig Start Date End Date Taking? Authorizing Provider   cyclobenzaprine (FLEXERIL) 10 MG tablet Take 1 tablet (10 mg total) by mouth 2 (two) times daily as needed for muscle spasms. 06/01/14   Kaitlyn Szekalski, PA-C   BP 119/78  Pulse 92  Temp(Src) 98.2 F (36.8 C) (Oral)  Resp 18  SpO2 100%  Physical Exam  Nursing note and vitals reviewed. Constitutional: He is oriented to person, place, and time. He appears well-developed and well-nourished.  HENT:  Head: Normocephalic and atraumatic.  Mouth/Throat: Oropharynx is clear and moist.  Eyes: Conjunctivae and EOM are normal. Pupils are equal, round, and reactive to light.  Neck: Normal range of motion and full passive range of motion without pain. Neck supple. No spinous process tenderness and no muscular tenderness present. No rigidity.  No meningismus  Cardiovascular: Normal rate, regular rhythm and normal heart sounds.   Pulmonary/Chest: Effort normal and breath sounds normal.  Abdominal: Soft. Bowel sounds are normal.  Musculoskeletal: Normal range of motion.       Cervical back: Normal.       Thoracic back: Normal.       Lumbar back: Normal.  Diffuse back pain without focal tenderness. No midline deformities. Full ROM. Negative straight leg raise bilaterally. Normal strength and sensation throughout BLE.  Ambulating without difficulty.   Neurological: He is alert and oriented to person, place, and time.  Skin: Skin is warm and dry.  Psychiatric: He has a normal mood and affect.    ED Course  Procedures (including critical care time)  DIAGNOSTIC STUDIES: Oxygen Saturation is 100%  on RA, normal by my interpretation.    COORDINATION OF CARE: 9:25 AM-Advised pt xray was not necessary since there wasn't an injury and he agrees. Discussed treatment plan which includes pain medication with pt at bedside and pt agreed to plan.   Labs Review Labs Reviewed - No data to display  Imaging Review No results found.   EKG Interpretation None      MDM   Final diagnoses:  Back pain,  unspecified location   Diffuse back pain without focal tenderness or deformities, no recent injuries-- imaging deferred.  No red flag sx or focal neurologic deficits on exam today.  No nuchal rigidity, fever, or headache to suggest meningitis.  Will d/c home with robaxin and prednisone.  FU with PCP in the area, resource guide given.  Discussed plan with patient, he/she acknowledged understanding and agreed with plan of care.  Return precautions given for new or worsening symptoms.  I personally performed the services described in this documentation, which was scribed in my presence. The recorded information has been reviewed and is accurate.  Garlon Hatchet, PA-C 07/25/14 1038

## 2014-07-25 NOTE — ED Notes (Signed)
Pt reports waking up yesterday morning with pain to neck/upper back that radiates into shoulders and down his legs. Denies any urinary or bowel incontinence. Denies injury to back.

## 2014-07-25 NOTE — ED Provider Notes (Signed)
Medical screening examination/treatment/procedure(s) were performed by non-physician practitioner and as supervising physician I was immediately available for consultation/collaboration.   EKG Interpretation None        Gershom Brobeck, DO 07/25/14 1818 

## 2014-09-20 ENCOUNTER — Emergency Department (HOSPITAL_COMMUNITY)
Admission: EM | Admit: 2014-09-20 | Discharge: 2014-09-20 | Disposition: A | Discharge status: Home / Self Care | Payer: Self-pay | Attending: Emergency Medicine | Admitting: Emergency Medicine

## 2014-09-20 ENCOUNTER — Encounter (HOSPITAL_COMMUNITY): Payer: Self-pay | Admitting: Emergency Medicine

## 2014-09-20 DIAGNOSIS — K088 Other specified disorders of teeth and supporting structures: Secondary | ICD-10-CM | POA: Insufficient documentation

## 2014-09-20 DIAGNOSIS — Z720 Tobacco use: Secondary | ICD-10-CM | POA: Insufficient documentation

## 2014-09-20 DIAGNOSIS — K029 Dental caries, unspecified: Secondary | ICD-10-CM | POA: Insufficient documentation

## 2014-09-20 DIAGNOSIS — K0889 Other specified disorders of teeth and supporting structures: Secondary | ICD-10-CM

## 2014-09-20 DIAGNOSIS — Z8739 Personal history of other diseases of the musculoskeletal system and connective tissue: Secondary | ICD-10-CM | POA: Insufficient documentation

## 2014-09-20 MED ORDER — PENICILLIN V POTASSIUM 250 MG PO TABS
500.0000 mg | ORAL_TABLET | Freq: Once | ORAL | Status: AC
  Administered 2014-09-20: 500 mg via ORAL
  Filled 2014-09-20: qty 2

## 2014-09-20 MED ORDER — PENICILLIN V POTASSIUM 500 MG PO TABS: 500.0000 mg | ORAL_TABLET | Freq: Four times a day (QID) | ORAL | Status: AC

## 2014-09-20 MED ORDER — IBUPROFEN 800 MG PO TABS: 800.0000 mg | ORAL_TABLET | Freq: Three times a day (TID) | ORAL | Status: DC

## 2014-09-20 MED ORDER — IBUPROFEN 800 MG PO TABS
800.0000 mg | ORAL_TABLET | Freq: Once | ORAL | Status: AC
  Administered 2014-09-20: 800 mg via ORAL
  Filled 2014-09-20: qty 1

## 2014-09-20 NOTE — Discharge Instructions (Signed)
Dental Pain Call Dr. Norris Crossurner's office today to schedule the next available office appointment. Tell office staff that you were seen here Toothache is pain in or around a tooth. It may get worse with chewing or with cold or heat.  HOME CARE  Your dentist may use a numbing medicine during treatment. If so, you may need to avoid eating until the medicine wears off. Ask your dentist about this.  Only take medicine as told by your dentist or doctor.  Avoid chewing food near the painful tooth until after all treatment is done. Ask your dentist about this. GET HELP RIGHT AWAY IF:   The problem gets worse or new problems appear.  You have a fever.  There is redness and puffiness (swelling) of the face, jaw, or neck.  You cannot open your mouth.  There is pain in the jaw.  There is very bad pain that is not helped by medicine. MAKE SURE YOU:   Understand these instructions.  Will watch your condition.  Will get help right away if you are not doing well or get worse. Document Released: 04/06/2008 Document Revised: 01/11/2012 Document Reviewed: 04/06/2008 [facility] Patient Information 2015 [employer]. This information is not intended to replace advice given to you by your health care provider. Make sure you discuss any questions you have with your health care provider.

## 2014-09-20 NOTE — ED Notes (Signed)
Pt A&OX4, ambulatory at d/c with steady gait, NAD 

## 2014-09-20 NOTE — ED Notes (Signed)
Pt. reports left upper molar pain for 2 days with swelling unrelieved by OTC pain medication .

## 2014-09-20 NOTE — ED Provider Notes (Signed)
CSN: [id number]     Arrival date & time 09/20/14  0306 History   First MD Initiated Contact with Patient 09/20/14 0600     Chief Complaint  Patient presents with  . Dental Pain     (Consider location/radiation/quality/duration/timing/severity/associated sxs/prior Treatment) HPI Complains of pain at tooth numbers 15 and 16 for the past several weeks.patient reports he saw a dentist 2 weeks ago who advised him that he cannot pull teeth cause of insurance reasons. Pain is worse with chewing and worse when air hits the teeth.pain is improved after treating himself with Aleve, 5 tablets at a time He denies fever. Denies other associated symptoms. Past Medical History  Diagnosis Date  . Sciatica    Past Surgical History  Procedure Laterality Date  . Leg surgery     No family history on file. History  Substance Use Topics  . Smoking status: Current Some Day Smoker    Types: Cigarettes  . Smokeless tobacco: Not on file  . Alcohol Use: No  positive marijuana use  Review of Systems  Constitutional: Negative.   HENT: Positive for dental problem.   Respiratory: Negative.   Allergic/Immunologic: Negative.   All other systems reviewed and are negative.     Allergies  Review of patient's allergies indicates no known allergies.  Home Medications   Prior to Admission medications   Not on File   BP 140/83 mmHg  Pulse 104  Temp(Src) 98.8 F (37.1 C) (Oral)  Resp 16  SpO2 95% Physical Exam  Constitutional: He is oriented to person, place, and time. He appears well-developed and well-nourished. No distress.  HENT:  Head: Normocephalic and atraumatic.  Poor dentition generally, multiple caries. No fluctuance of gingiva, no trismus, teeth numbers 15 and 16 with obvious caries, tender  Eyes: Conjunctivae are normal. Pupils are equal, round, and reactive to light.  Neck: Neck supple. No tracheal deviation present. No thyromegaly present.  Pulmonary/Chest: Effort normal.   Abdominal: He exhibits no distension.  Musculoskeletal: Normal range of motion.  Lymphadenopathy:    He has no cervical adenopathy.  Neurological: He is alert and oriented to person, place, and time. No cranial nerve deficit. Coordination normal.  Skin: Skin is warm and dry. No rash noted.  Psychiatric: He has a normal mood and affect.  Nursing note and vitals reviewed.   ED Course  Procedures (including critical care time) Labs Review Labs Reviewed - No data to display  Imaging Review No results found.   EKG Interpretation None      MDM  Patient with multiple dental caries. Suspect periapical abscesses Plan dental referral to dentist on call. Prescriptions ibuprofen, Pen-Vee K Diagnosis dental pain Final diagnoses:  None        Doug SouSam Nayara Taplin, MD 09/20/14 [phone]

## 2015-01-31 ENCOUNTER — Encounter (HOSPITAL_COMMUNITY): Payer: Self-pay | Admitting: Emergency Medicine

## 2015-01-31 ENCOUNTER — Emergency Department (HOSPITAL_COMMUNITY)
Admission: EM | Admit: 2015-01-31 | Discharge: 2015-01-31 | Discharge status: Against Medical Advice | Payer: Self-pay | Attending: Emergency Medicine | Admitting: Emergency Medicine

## 2015-01-31 DIAGNOSIS — R1031 Right lower quadrant pain: Secondary | ICD-10-CM | POA: Insufficient documentation

## 2015-01-31 DIAGNOSIS — R109 Unspecified abdominal pain: Secondary | ICD-10-CM

## 2015-01-31 DIAGNOSIS — Z791 Long term (current) use of non-steroidal anti-inflammatories (NSAID): Secondary | ICD-10-CM | POA: Insufficient documentation

## 2015-01-31 DIAGNOSIS — M5430 Sciatica, unspecified side: Secondary | ICD-10-CM | POA: Insufficient documentation

## 2015-01-31 DIAGNOSIS — Z720 Tobacco use: Secondary | ICD-10-CM | POA: Insufficient documentation

## 2015-01-31 LAB — CBC
HCT: 42.5 % (ref 39.0–52.0)
HEMOGLOBIN: 14.8 g/dL (ref 13.0–17.0)
MCH: 33.4 pg (ref 26.0–34.0)
MCHC: 34.8 g/dL (ref 30.0–36.0)
MCV: 95.9 fL (ref 78.0–100.0)
PLATELETS: 334 10*3/uL (ref 150–400)
RBC: 4.43 MIL/uL (ref 4.22–5.81)
RDW: 12.7 % (ref 11.5–15.5)
WBC: 4.8 10*3/uL (ref 4.0–10.5)

## 2015-01-31 LAB — COMPREHENSIVE METABOLIC PANEL
ALK PHOS: 64 U/L (ref 39–117)
ALT: 21 U/L (ref 0–53)
AST: 24 U/L (ref 0–37)
Albumin: 4 g/dL (ref 3.5–5.2)
Anion gap: 9 (ref 5–15)
BUN: 9 mg/dL (ref 6–23)
CALCIUM: 9.4 mg/dL (ref 8.4–10.5)
CO2: 24 mmol/L (ref 19–32)
Chloride: 106 mmol/L (ref 96–112)
Creatinine, Ser: 1 mg/dL (ref 0.50–1.35)
GFR, EST NON AFRICAN AMERICAN: 89 mL/min — AB (ref >90)
GLUCOSE: 112 mg/dL — AB (ref 70–99)
POTASSIUM: 4.1 mmol/L (ref 3.5–5.1)
Sodium: 139 mmol/L (ref 135–145)
TOTAL PROTEIN: 7.2 g/dL (ref 6.0–8.3)
Total Bilirubin: 0.6 mg/dL (ref 0.3–1.2)

## 2015-01-31 LAB — LIPASE, BLOOD: LIPASE: 19 U/L (ref 11–59)

## 2015-01-31 NOTE — ED Notes (Signed)
Pt refusing CT scan. Is "irritated and wants to go." MD aware. Pt to sign AMA.

## 2015-01-31 NOTE — ED Notes (Signed)
Pt here from home with c/o right lower abd pain , no n/v

## 2015-01-31 NOTE — ED Provider Notes (Signed)
CSN: [id number]     Arrival date & time 01/31/15  1251 History   First MD Initiated Contact with Patient 01/31/15 1403     Chief Complaint  Patient presents with  . Abdominal Pain     (Consider location/radiation/quality/duration/timing/severity/associated sxs/prior Treatment) HPI Comments: 49 year old male, no past abdominal surgical history who presents with a complaint of right lower quadrant pain, this started several hours ago when he was at work on a ladder, he felt gradual onset of an aching and dull pain in that area, has been persistent, not associated with nausea vomiting diarrhea dysuria and is not postprandial. He had similar symptoms 1 week ago but they resolved spontaneously. He denies any radiation of the pain to the back or to his testicles or scrotum, denies fevers chills coughing shortness of breath back pain headache swelling numbness or any other complaints. The pain is currently 3 out of 10  Patient is a 49 y.o. male presenting with abdominal pain. The history is provided by the patient.  Abdominal Pain   Past Medical History  Diagnosis Date  . Sciatica    Past Surgical History  Procedure Laterality Date  . Leg surgery     History reviewed. No pertinent family history. History  Substance Use Topics  . Smoking status: Current Some Day Smoker    Types: Cigarettes  . Smokeless tobacco: Not on file  . Alcohol Use: No    Review of Systems  Gastrointestinal: Positive for abdominal pain.  All other systems reviewed and are negative.     Allergies  Review of patient's allergies indicates no known allergies.  Home Medications   Prior to Admission medications   Medication Sig Start Date End Date Taking? Authorizing Provider  ibuprofen (ADVIL,MOTRIN) 800 MG tablet Take 1 tablet (800 mg total) by mouth 3 (three) times daily. Patient not taking: Reported on 01/31/2015 09/20/14   Doug Sou, MD   BP 113/77 mmHg  Pulse 86  Temp(Src) 98.1 F (36.7 C)  (Oral)  Resp 20  Ht  (1.88 m)  Wt 194 lb 7 oz (88.196 kg)  BMI 24.95 kg/m2  SpO2 100% Physical Exam  Constitutional: He appears well-developed and well-nourished. No distress.  HENT:  Head: Normocephalic and atraumatic.  Mouth/Throat: Oropharynx is clear and moist. No oropharyngeal exudate.  Eyes: Conjunctivae and EOM are normal. Pupils are equal, round, and reactive to light. Right eye exhibits no discharge. Left eye exhibits no discharge. No scleral icterus.  Neck: Normal range of motion. Neck supple. No JVD present. No thyromegaly present.  Cardiovascular: Normal rate, regular rhythm, normal heart sounds and intact distal pulses.  Exam reveals no gallop and no friction rub.   No murmur heard. Pulmonary/Chest: Effort normal and breath sounds normal. No respiratory distress. He has no wheezes. He has no rales.  Abdominal: Soft. Bowel sounds are normal. He exhibits no distension and no mass. There is tenderness ( Focal tenderness in the right lower quadrant, no guarding, no peritoneal signs, no Rovsing sign, no Carnett's sign).  Musculoskeletal: Normal range of motion. He exhibits no edema or tenderness.  Lymphadenopathy:    He has no cervical adenopathy.  Neurological: He is alert. Coordination normal.  Skin: Skin is warm and dry. No rash noted. No erythema.  Psychiatric: He has a normal mood and affect. His behavior is normal.  Nursing note and vitals reviewed.   ED Course  Procedures (including critical care time) Labs Review Labs Reviewed  COMPREHENSIVE METABOLIC PANEL - Abnormal; Notable for the  following:    Glucose, Bld 112 (*)    GFR calc non Af Amer 89 (*)    All other components within normal limits  CBC  LIPASE, BLOOD    Imaging Review No results found.    MDM   Final diagnoses:  Abdominal pain  Abdominal pain    Doubt kidney stone, possibly appendicitis, otherwise abdomen is soft and nontender except for McBurney's point, patient declines pain  medications, no leukocytosis, CT scan pending.  After CT scan was ordered the patient then decided to go home, he does not want to stay for CT scan to evaluate for appendicitis, he has been warned of the indications for return and is aware that the reason for CT scan is to rule out appendicitis. He is aware that he has increased risk for morbidity and mortality by refusing this procedure. He will leave AGAINST MEDICAL ADVICE. He appears to have medical decision-making capacity at this time.    Eber HongBrian Evee Liska, MD 01/31/15 1520

## 2015-10-02 ENCOUNTER — Encounter (HOSPITAL_COMMUNITY): Payer: Self-pay | Admitting: Emergency Medicine

## 2015-10-02 ENCOUNTER — Emergency Department (HOSPITAL_COMMUNITY)
Admission: EM | Admit: 2015-10-02 | Discharge: 2015-10-02 | Disposition: A | Discharge status: Home / Self Care | Payer: Self-pay | Attending: Emergency Medicine | Admitting: Emergency Medicine

## 2015-10-02 DIAGNOSIS — K047 Periapical abscess without sinus: Secondary | ICD-10-CM | POA: Insufficient documentation

## 2015-10-02 DIAGNOSIS — R220 Localized swelling, mass and lump, head: Secondary | ICD-10-CM

## 2015-10-02 DIAGNOSIS — F17210 Nicotine dependence, cigarettes, uncomplicated: Secondary | ICD-10-CM | POA: Insufficient documentation

## 2015-10-02 MED ORDER — PENICILLIN V POTASSIUM 250 MG PO TABS
500.0000 mg | ORAL_TABLET | Freq: Once | ORAL | Status: AC
  Administered 2015-10-02: 500 mg via ORAL
  Filled 2015-10-02: qty 2

## 2015-10-02 MED ORDER — PENICILLIN V POTASSIUM 500 MG PO TABS: 500.0000 mg | ORAL_TABLET | Freq: Four times a day (QID) | ORAL | Status: DC

## 2015-10-02 NOTE — Discharge Instructions (Signed)
Take Veetid as directed until gone. Return to the ED with worsening or concerning symptoms. Refer to attached documents for more information.

## 2015-10-02 NOTE — ED Provider Notes (Signed)
CSN: [id number]     Arrival date & time 10/02/15  4098 History   First MD Initiated Contact with Patient 10/02/15 [phone]     Chief Complaint  Patient presents with  . Facial Swelling     (Consider location/radiation/quality/duration/timing/severity/associated sxs/prior Treatment) HPI Comments: The patient is a 49 year old otherwise healthy male who presents with dental pain that started gradually 2 days ago. The dental pain is moderate, constant and has gradually improved since the onset. The pain is aching and located in left upper gingiva. The pain does not radiate. Eating makes the pain worse. Nothing makes the pain better. The patient has tried Aleve for pain with some relief. Patient reports associated right facial swelling. Patient denies headache, neck pain/stiffness, fever, NVD, edema, sore throat, throat swelling, wheezing, SOB, chest pain, abdominal pain.      Past Medical History  Diagnosis Date  . Sciatica    Past Surgical History  Procedure Laterality Date  . Leg surgery     No family history on file. Social History  Substance Use Topics  . Smoking status: Current Some Day Smoker    Types: Cigarettes  . Smokeless tobacco: None  . Alcohol Use: No    Review of Systems  Constitutional: Negative for fever, chills and fatigue.  HENT: Positive for dental problem and facial swelling. Negative for trouble swallowing.   Eyes: Negative for visual disturbance.  Respiratory: Negative for shortness of breath.   Cardiovascular: Negative for chest pain and palpitations.  Gastrointestinal: Negative for nausea, vomiting, abdominal pain and diarrhea.  Genitourinary: Negative for dysuria and difficulty urinating.  Musculoskeletal: Negative for arthralgias and neck pain.  Skin: Negative for color change.  Neurological: Negative for dizziness and weakness.  Psychiatric/Behavioral: Negative for dysphoric mood.      Allergies  Review of patient's allergies indicates no known  allergies.  Home Medications   Prior to Admission medications   Medication Sig Start Date End Date Taking? Authorizing Provider  ibuprofen (ADVIL,MOTRIN) 800 MG tablet Take 1 tablet (800 mg total) by mouth 3 (three) times daily. Patient not taking: Reported on 01/31/2015 09/20/14   Doug Sou, MD   BP 125/70 mmHg  Pulse 88  Temp(Src) 98.6 F (37 C) (Oral)  Resp 19  Ht 6' (1.829 m)  Wt 85.276 kg  BMI 25.49 kg/m2  SpO2 96% Physical Exam  Constitutional: He is oriented to person, place, and time. He appears well-developed and well-nourished. No distress.  HENT:  Head: Normocephalic and atraumatic.  Poor dentition. Multiple cracked and decayed teeth. No tenderness to percussion. Right upper gingival swelling and tenderness to palpation. Right generalized facial swelling. No visible abscess.   Eyes: Conjunctivae and EOM are normal.  Neck: Normal range of motion.  Cardiovascular: Normal rate and regular rhythm.  Exam reveals no gallop and no friction rub.   No murmur heard. Pulmonary/Chest: Effort normal and breath sounds normal. He has no wheezes. He has no rales. He exhibits no tenderness.  Abdominal: Soft. He exhibits no distension. There is no tenderness. There is no rebound.  Musculoskeletal: Normal range of motion.  Neurological: He is alert and oriented to person, place, and time. Coordination normal.  Speech is goal-oriented. Moves limbs without ataxia.   Skin: Skin is warm and dry.  Psychiatric: He has a normal mood and affect. His behavior is normal.  Nursing note and vitals reviewed.   ED Course  Procedures (including critical care time) Labs Review Labs Reviewed - No data to display  Imaging  Review No results found. I have personally reviewed and evaluated these images and lab results as part of my medical decision-making.   EKG Interpretation None      MDM   Final diagnoses:  Dental infection  Right facial swelling    7:31 AM Patient appears to  have facial swelling likely originating from a dental source. Patient will be treated with veetid and have instructions to return with worsening or concerning symptoms. No signs of ludwigs angina. Vitals stable and patient afebrile.     Emilia BeckKaitlyn Jaszmine Navejas, PA-C 10/02/15 0737  Layla MawKristen N Ward, DO 10/02/15 [phone]

## 2015-10-02 NOTE — ED Notes (Signed)
Two days ago, pt reported that his right side of his face began to swell possibly from a popcorn kernel stuck in his gum.  He tried several "home fixes" and took RayneAlieve however his face was even more swollen this AM.

## 2015-10-02 NOTE — ED Notes (Signed)
PA at bedside.

## 2015-12-03 ENCOUNTER — Encounter (HOSPITAL_COMMUNITY): Payer: Self-pay | Admitting: Emergency Medicine

## 2015-12-03 ENCOUNTER — Emergency Department (HOSPITAL_COMMUNITY)
Admission: EM | Admit: 2015-12-03 | Discharge: 2015-12-03 | Disposition: A | Discharge status: Home / Self Care | Payer: BLUE CROSS/BLUE SHIELD | Attending: Emergency Medicine | Admitting: Emergency Medicine

## 2015-12-03 DIAGNOSIS — M545 Low back pain, unspecified: Secondary | ICD-10-CM

## 2015-12-03 DIAGNOSIS — Z792 Long term (current) use of antibiotics: Secondary | ICD-10-CM | POA: Diagnosis not present

## 2015-12-03 DIAGNOSIS — M549 Dorsalgia, unspecified: Secondary | ICD-10-CM | POA: Diagnosis present

## 2015-12-03 DIAGNOSIS — F17210 Nicotine dependence, cigarettes, uncomplicated: Secondary | ICD-10-CM | POA: Insufficient documentation

## 2015-12-03 MED ORDER — HYDROCODONE-ACETAMINOPHEN 5-325 MG PO TABS
2.0000 | ORAL_TABLET | Freq: Once | ORAL | Status: AC
  Administered 2015-12-03: 2 via ORAL
  Filled 2015-12-03: qty 2

## 2015-12-03 MED ORDER — KETOROLAC TROMETHAMINE 60 MG/2ML IM SOLN
60.0000 mg | Freq: Once | INTRAMUSCULAR | Status: AC
  Administered 2015-12-03: 60 mg via INTRAMUSCULAR
  Filled 2015-12-03: qty 2

## 2015-12-03 MED ORDER — LIDOCAINE 5 % EX PTCH
2.0000 | MEDICATED_PATCH | Freq: Once | CUTANEOUS | Status: DC
  Administered 2015-12-03: 2 via TRANSDERMAL
  Filled 2015-12-03: qty 2

## 2015-12-03 MED ORDER — TRAMADOL HCL 50 MG PO TABS: 50.0000 mg | ORAL_TABLET | Freq: Two times a day (BID) | ORAL | Status: DC | PRN

## 2015-12-03 NOTE — ED Notes (Signed)
Pt here with c/o low back pain that started yesterday at work when he bent over to pick up a tool box

## 2015-12-03 NOTE — ED Provider Notes (Signed)
CSN: [id number]     Arrival date & time 12/03/15  4098 History   First MD Initiated Contact with Patient 12/03/15 0703     Chief Complaint  Patient presents with  . Back Pain     (Consider location/radiation/quality/duration/timing/severity/associated sxs/prior Treatment) HPI   Glen Morton is a 50 y.o. male with past medical history of back pain and sciatica presenting today with worsening back pain.  Patient states he was doing heavy lifting throughout the day yesterday, he was bending at his back. 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 This has not radiated to his left paralumbar area. There is no radiation down his legs. It is worse when he stands up. He denies any abdominal pain, incontinence, lower extremity numbness, history of cancer or IV drug use. He states this is consistent with his prior musculoskeletal back pain. He did not take anything prior to arrival. Patient has no further complaints or neurological symptoms.  10 Systems reviewed and are negative for acute change except as noted in the HPI.      Past Medical History  Diagnosis Date  . Sciatica    Past Surgical History  Procedure Laterality Date  . Leg surgery     History reviewed. No pertinent family history. Social History  Substance Use Topics  . Smoking status: Current Some Day Smoker    Types: Cigarettes  . Smokeless tobacco: None  . Alcohol Use: No    Review of Systems    Allergies  Review of patient's allergies indicates no known allergies.  Home Medications   Prior to Admission medications   Medication Sig Start Date End Date Taking? Authorizing Provider  ibuprofen (ADVIL,MOTRIN) 800 MG tablet Take 1 tablet (800 mg total) by mouth 3 (three) times daily. Patient not taking: Reported on 01/31/2015 09/20/14   Doug Sou, MD  penicillin v potassium (VEETID) 500 MG tablet Take 1 tablet (500 mg total) by mouth 4 (four) times daily. 10/02/15   Kaitlyn Szekalski,  PA-C   BP 125/85 mmHg  Pulse 92  Temp(Src) 98.1 F (36.7 C)  Resp 16  SpO2 96% Physical Exam  Constitutional: He is oriented to person, place, and time. Vital signs are normal. He appears well-developed and well-nourished.  Non-toxic appearance. He does not appear ill. No distress.  HENT:  Head: Normocephalic and atraumatic.  Nose: Nose normal.  Mouth/Throat: Oropharynx is clear and moist. No oropharyngeal exudate.  Eyes: Conjunctivae and EOM are normal. Pupils are equal, round, and reactive to light. No scleral icterus.  Neck: Normal range of motion. Neck supple. No tracheal deviation, no edema, no erythema and normal range of motion present. No thyroid mass and no thyromegaly present.  Cardiovascular: Normal rate, regular rhythm, S1 normal, S2 normal, normal heart sounds, intact distal pulses and normal pulses.  Exam reveals no gallop and no friction rub.   No murmur heard. Pulmonary/Chest: Effort normal and breath sounds normal. No respiratory distress. He has no wheezes. He has no rhonchi. He has no rales.  Abdominal: Soft. Normal appearance and bowel sounds are normal. He exhibits no distension, no ascites and no mass. There is no hepatosplenomegaly. There is no tenderness. There is no rebound, no guarding and no CVA tenderness.  Musculoskeletal: Normal range of motion. He exhibits no edema or tenderness.  Bilateral paraspinal lumbar tenderness to palpation. Negative straight leg raise test bilaterally.  Lymphadenopathy:    He has no cervical adenopathy.  Neurological: He is alert and oriented to  person, place, and time. He has normal strength. No cranial nerve deficit or sensory deficit.  Normal strength and sensation in all joints. Normal cerebellar testing. Normal gait.  Skin: Skin is warm, dry and intact. No petechiae and no rash noted. He is not diaphoretic. No erythema. No pallor.  Psychiatric: He has a normal mood and affect. His behavior is normal. Judgment normal.  Nursing  note and vitals reviewed.   ED Course  Procedures (including critical care time) Labs Review Labs Reviewed - No data to display  Imaging Review No results found. I have personally reviewed and evaluated these images and lab results as part of my medical decision-making.   EKG Interpretation None      MDM   Final diagnoses:  None    Patient presents to the emergency department for lower back pain. This is aggravated by heavy lifting and bending his back. History of physical exam is not consistent with AAA or spinal pathology. He was given lidocaine patch, Toradol, Norco for his symptoms. He is advised to continue ibuprofen at home, tramadol for breakthrough pain. He appears well in no acute distress, vital signs were within his normal limits and he is safe for discharge.    Tomasita Crumble, MD 12/03/15 [phone]

## 2015-12-03 NOTE — Discharge Instructions (Signed)
Back Exercises Glen Morton, use the exercises below to help with your back pain.  Rest your back for 1 week before doing any heavy lifting.  Take ibuprofen at home as needed for pain. If pain is severe, take tramadol.  See a primary care doctor within 3 days for close follow up. If symptoms worsen, come back to the ED immediately. Thank you. If you have pain in your back, do these exercises 2-3 times each day or as told by your doctor. When the pain goes away, do the exercises once each day, but repeat the steps more times for each exercise (do more repetitions). If you do not have pain in your back, do these exercises once each day or as told by your doctor. EXERCISES Single Knee to Chest Do these steps 3-5 times in a row for each leg: 1. Lie on your back on a firm bed or the floor with your legs stretched out. 2. Bring one knee to your chest. 3. Hold your knee to your chest by grabbing your knee or thigh. 4. Pull on your knee until you feel a gentle stretch in your lower back. 5. Keep doing the stretch for 10-30 seconds. 6. Slowly let go of your leg and straighten it. Pelvic Tilt Do these steps 5-10 times in a row: 1. Lie on your back on a firm bed or the floor with your legs stretched out. 2. Bend your knees so they point up to the ceiling. Your feet should be flat on the floor. 3. Tighten your lower belly (abdomen) muscles to press your lower back against the floor. This will make your tailbone point up to the ceiling instead of pointing down to your feet or the floor. 4. Stay in this position for 5-10 seconds while you gently tighten your muscles and breathe evenly. Cat-Cow Do these steps until your lower back bends more easily: 1. Get on your hands and knees on a firm surface. Keep your hands under your shoulders, and keep your knees under your hips. You may put padding under your knees. 2. Let your head hang down, and make your tailbone point down to the floor so your lower back is round  like the back of a cat. 3. Stay in this position for 5 seconds. 4. Slowly lift your head and make your tailbone point up to the ceiling so your back hangs low (sags) like the back of a cow. 5. Stay in this position for 5 seconds. Press-Ups Do these steps 5-10 times in a row: 1. Lie on your belly (face-down) on the floor. 2. Place your hands near your head, about shoulder-width apart. 3. While you keep your back relaxed and keep your hips on the floor, slowly straighten your arms to raise the top half of your body and lift your shoulders. Do not use your back muscles. To make yourself more comfortable, you may change where you place your hands. 4. Stay in this position for 5 seconds. 5. Slowly return to lying flat on the floor. Bridges Do these steps 10 times in a row: 1. Lie on your back on a firm surface. 2. Bend your knees so they point up to the ceiling. Your feet should be flat on the floor. 3. Tighten your butt muscles and lift your butt off of the floor until your waist is almost as high as your knees. If you do not feel the muscles working in your butt and the back of your thighs, slide your feet 1-2  inches farther away from your butt. 4. Stay in this position for 3-5 seconds. 5. Slowly lower your butt to the floor, and let your butt muscles relax. If this exercise is too easy, try doing it with your arms crossed over your chest. Belly Crunches Do these steps 5-10 times in a row: 1. Lie on your back on a firm bed or the floor with your legs stretched out. 2. Bend your knees so they point up to the ceiling. Your feet should be flat on the floor. 3. Cross your arms over your chest. 4. Tip your chin a little bit toward your chest but do not bend your neck. 5. Tighten your belly muscles and slowly raise your chest just enough to lift your shoulder blades a tiny bit off of the floor. 6. Slowly lower your chest and your head to the floor. Back Lifts Do these steps 5-10 times in a  row: 1. Lie on your belly (face-down) with your arms at your sides, and rest your forehead on the floor. 2. Tighten the muscles in your legs and your butt. 3. Slowly lift your chest off of the floor while you keep your hips on the floor. Keep the back of your head in line with the curve in your back. Look at the floor while you do this. 4. Stay in this position for 3-5 seconds. 5. Slowly lower your chest and your face to the floor. GET HELP IF:  Your back pain gets a lot worse when you do an exercise.  Your back pain does not lessen 2 hours after you exercise. If you have any of these problems, stop doing the exercises. Do not do them again unless your doctor says it is okay. GET HELP RIGHT AWAY IF:  You have sudden, very bad back pain. If this happens, stop doing the exercises. Do not do them again unless your doctor says it is okay.   This information is not intended to replace advice given to you by your health care provider. Make sure you discuss any questions you have with your health care provider.   Document Released: 11/21/2010 Document Revised: 07/10/2015 Document Reviewed: 12/13/2014 [employer] Interactive Patient Education [employer]

## 2016-03-04 ENCOUNTER — Emergency Department (HOSPITAL_COMMUNITY): Payer: BLUE CROSS/BLUE SHIELD

## 2016-03-04 ENCOUNTER — Emergency Department (HOSPITAL_COMMUNITY)
Admission: EM | Admit: 2016-03-04 | Discharge: 2016-03-04 | Disposition: A | Discharge status: Home / Self Care | Payer: BLUE CROSS/BLUE SHIELD | Attending: Emergency Medicine | Admitting: Emergency Medicine

## 2016-03-04 ENCOUNTER — Encounter (HOSPITAL_COMMUNITY): Payer: Self-pay | Admitting: Family Medicine

## 2016-03-04 DIAGNOSIS — W11XXXA Fall on and from ladder, initial encounter: Secondary | ICD-10-CM | POA: Insufficient documentation

## 2016-03-04 DIAGNOSIS — Y9289 Other specified places as the place of occurrence of the external cause: Secondary | ICD-10-CM | POA: Insufficient documentation

## 2016-03-04 DIAGNOSIS — Y998 Other external cause status: Secondary | ICD-10-CM | POA: Insufficient documentation

## 2016-03-04 DIAGNOSIS — F17210 Nicotine dependence, cigarettes, uncomplicated: Secondary | ICD-10-CM | POA: Insufficient documentation

## 2016-03-04 DIAGNOSIS — S3992XA Unspecified injury of lower back, initial encounter: Secondary | ICD-10-CM | POA: Insufficient documentation

## 2016-03-04 DIAGNOSIS — S50811A Abrasion of right forearm, initial encounter: Secondary | ICD-10-CM | POA: Insufficient documentation

## 2016-03-04 DIAGNOSIS — S46911A Strain of unspecified muscle, fascia and tendon at shoulder and upper arm level, right arm, initial encounter: Secondary | ICD-10-CM | POA: Insufficient documentation

## 2016-03-04 DIAGNOSIS — S199XXA Unspecified injury of neck, initial encounter: Secondary | ICD-10-CM | POA: Insufficient documentation

## 2016-03-04 DIAGNOSIS — Z8739 Personal history of other diseases of the musculoskeletal system and connective tissue: Secondary | ICD-10-CM | POA: Insufficient documentation

## 2016-03-04 DIAGNOSIS — Y9389 Activity, other specified: Secondary | ICD-10-CM | POA: Insufficient documentation

## 2016-03-04 MED ORDER — METHOCARBAMOL 500 MG PO TABS: 1000.0000 mg | ORAL_TABLET | Freq: Four times a day (QID) | ORAL | Status: DC

## 2016-03-04 MED ORDER — NAPROXEN 500 MG PO TABS: 500.0000 mg | ORAL_TABLET | Freq: Two times a day (BID) | ORAL | Status: DC

## 2016-03-04 NOTE — ED Provider Notes (Signed)
CSN: [id number]     Arrival date & time 03/04/16  0944 History  By signing my name below, I, Sonum Patel, attest that this documentation has been prepared under the direction and in the presence of RaytheonJosh Tiya Schrupp PA-C. Electronically Signed: Sonum Patel, Neurosurgeoncribe. 03/04/2016. 10:19 AM.    Chief Complaint  Patient presents with  . Fall  . Arm Pain    The history is provided by the patient. No language interpreter was used.     HPI Comments: Glen Morton is a 50 y.o. male who presents to the Emergency Department complaining of a right upper extremity injury that occurred yesterday. Patient states he was on a ladder when it slipped; states he grabbed a part of the wall with his RUE and hung on to keep from falling. He reports having abrasions to the right forearm, RUE pain, and posterior neck pain. He states the pain has worsened since onset and is aggravated by head movement and lifting BUE. He has used hot water and massaged area with minimal relief. He denies numbness.   Past Medical History  Diagnosis Date  . Sciatica    Past Surgical History  Procedure Laterality Date  . Leg surgery     History reviewed. No pertinent family history. Social History  Substance Use Topics  . Smoking status: Current Some Day Smoker    Types: Cigarettes  . Smokeless tobacco: None  . Alcohol Use: No    Review of Systems  Constitutional: Negative for fever and unexpected weight change.  Gastrointestinal: Negative for constipation.       Neg for fecal incontinence  Genitourinary: Negative for hematuria, flank pain and difficulty urinating.       Negative for urinary incontinence or retention  Musculoskeletal: Positive for myalgias (RUE), back pain, arthralgias (RUE) and neck pain.  Skin: Positive for wound (abrasions to RUE).  Neurological: Negative for syncope, weakness and numbness.       Negative for saddle paresthesias       Allergies  Review of patient's allergies indicates no known  allergies.  Home Medications   Prior to Admission medications   Medication Sig Start Date End Date Taking? Authorizing Provider  traMADol (ULTRAM) 50 MG tablet Take 1 tablet (50 mg total) by mouth every 12 (twelve) hours as needed. 12/03/15   Tomasita CrumbleAdeleke Oni, MD   BP 135/77 mmHg  Pulse 83  Temp(Src) 98.5 F (36.9 C) (Oral)  Resp 18  SpO2 98% Physical Exam  Constitutional: He appears well-developed and well-nourished.  HENT:  Head: Normocephalic and atraumatic.  Eyes: Conjunctivae are normal.  Neck: Normal range of motion.  Cardiovascular: Normal rate.   Pulmonary/Chest: Effort normal.  Abdominal: Soft. There is no tenderness. There is no CVA tenderness.  Musculoskeletal: He exhibits tenderness.       Right shoulder: He exhibits decreased range of motion and tenderness. He exhibits no bony tenderness.       Left shoulder: Normal.       Right elbow: Normal.      Right wrist: He exhibits normal range of motion.       Cervical back: He exhibits tenderness. He exhibits normal range of motion and no bony tenderness.       Right upper arm: He exhibits tenderness. He exhibits no bony tenderness and no swelling.       Right forearm: He exhibits tenderness (over abrasion).       Right hand: Normal.  No step-off noted with palpation of spine. Superficial abrasion of  right forearm.  Neurological: He is alert. He has normal reflexes. No sensory deficit. He exhibits normal muscle tone.  5/5 strength in entire lower extremities bilaterally. No sensation deficit.   Skin: Skin is warm and dry.  Psychiatric: He has a normal mood and affect.  Nursing note and vitals reviewed.   ED Course  Procedures (including critical care time)  DIAGNOSTIC STUDIES: Oxygen Saturation is 98% on RA, normal by my interpretation.    COORDINATION OF CARE: 10:25 AM Patient had negative imaging of the right shoulder. Discussed treatment plan (sling, Robaxin, and Naprosyn) with pt at bedside and pt agreed to plan.    Imaging Review Dg Shoulder Right  03/04/2016  CLINICAL DATA:  Right upper extremity injury. EXAM: RIGHT SHOULDER - 2+ VIEW COMPARISON:  None FINDINGS: There is no evidence of fracture or dislocation. There is no evidence of arthropathy or other focal bone abnormality. Soft tissues are unremarkable. IMPRESSION: Negative. Electronically Signed   By: Signa Kell M.D.   On: 03/04/2016 10:53   I have personally reviewed and evaluated these images  as part of my medical decision-making.  Vital signs reviewed and are as follows: Filed Vitals:   03/04/16 1006 03/04/16 1112  BP: 135/77 119/83  Pulse: 83 66  Temp: 98.5 F (36.9 C) 97.7 F (36.5 C)  Resp: 18 20   Patient informed of negative x-ray results. Discharged home with Robaxin. Counseled on rice protocol.  Patient counseled on proper use of muscle relaxant medication.  They were told not to drink alcohol, drive any vehicle, or do any dangerous activities while taking this medication.  Patient verbalized understanding.   MDM   Final diagnoses:  Shoulder strain, right, initial encounter  Forearm abrasion, right, initial encounter   Patient With muscle pain and tenderness after injury yesterday. X-ray of shoulder is negative. Upper extremity is neurovascularly intact. There is an abrasion of the right forearm without signs of infection. Will treat conservatively.  I personally performed the services described in this documentation, which was scribed in my presence. The recorded information has been reviewed and is accurate.   Renne Crigler, PA-C 03/04/16 1135  Benjiman Core, MD 03/04/16 [phone]

## 2016-03-04 NOTE — Discharge Instructions (Signed)
Please read and follow all provided instructions.  Your diagnoses today include:  1. Shoulder strain, right, initial encounter   2. Forearm abrasion, right, initial encounter     Tests performed today include:  An x-ray of the affected area - does NOT show any broken bones  Vital signs. See below for your results today.   Medications prescribed:   Robaxin (methocarbamol) - muscle relaxer medication  DO NOT drive or perform any activities that require you to be awake and alert because this medicine can make you drowsy.    Naproxen - anti-inflammatory pain medication  Do not exceed 500mg  naproxen every 12 hours, take with food  You have been prescribed an anti-inflammatory medication or NSAID. Take with food. Take smallest effective dose for the shortest duration needed for your pain. Stop taking if you experience stomach pain or vomiting.   Take any prescribed medications only as directed.  Home care instructions:   Follow any educational materials contained in this packet  Follow R.I.C.E. Protocol:  R - rest your injury   I  - use ice on injury without applying directly to skin  C - compress injury with bandage or splint  E - elevate the injury as much as possible  Follow-up instructions: Please follow-up with your primary care provider if you continue to have significant pain in 1 week. In this case you may have a more severe injury that requires further care.   Return instructions:   Please return if your fingers are numb or tingling, appear gray or blue, or you have severe pain (also elevate the arm and loosen splint or wrap if you were given one)  Please return to the Emergency Department if you experience worsening symptoms.   Please return if you have any other emergent concerns.  Additional Information:  Your vital signs today were: BP 135/77 mmHg   Pulse 83   Temp(Src) 98.5 F (36.9 C) (Oral)   Resp 18   SpO2 98% If your blood pressure (BP) was  elevated above 135/85 this visit, please have this repeated by your doctor within one month. --------------

## 2016-03-04 NOTE — ED Notes (Signed)
Pt here for fall yesterday. sts a ladder slipped and he held onto the wall. Abrasion to right FA. Pain in right shoulder and right neck. sts pain with coughing. Sts landed on feet.

## 2016-03-10 ENCOUNTER — Emergency Department (HOSPITAL_COMMUNITY)
Admission: EM | Admit: 2016-03-10 | Discharge: 2016-03-10 | Disposition: A | Discharge status: Home / Self Care | Payer: BLUE CROSS/BLUE SHIELD | Attending: Emergency Medicine | Admitting: Emergency Medicine

## 2016-03-10 ENCOUNTER — Encounter (HOSPITAL_COMMUNITY): Payer: Self-pay | Admitting: Emergency Medicine

## 2016-03-10 DIAGNOSIS — T148 Other injury of unspecified body region: Secondary | ICD-10-CM | POA: Insufficient documentation

## 2016-03-10 DIAGNOSIS — Z79899 Other long term (current) drug therapy: Secondary | ICD-10-CM | POA: Insufficient documentation

## 2016-03-10 DIAGNOSIS — Z791 Long term (current) use of non-steroidal anti-inflammatories (NSAID): Secondary | ICD-10-CM | POA: Insufficient documentation

## 2016-03-10 DIAGNOSIS — T148XXA Other injury of unspecified body region, initial encounter: Secondary | ICD-10-CM

## 2016-03-10 DIAGNOSIS — Y9289 Other specified places as the place of occurrence of the external cause: Secondary | ICD-10-CM | POA: Insufficient documentation

## 2016-03-10 DIAGNOSIS — W11XXXA Fall on and from ladder, initial encounter: Secondary | ICD-10-CM | POA: Insufficient documentation

## 2016-03-10 DIAGNOSIS — S4992XA Unspecified injury of left shoulder and upper arm, initial encounter: Secondary | ICD-10-CM | POA: Insufficient documentation

## 2016-03-10 DIAGNOSIS — Y9389 Activity, other specified: Secondary | ICD-10-CM | POA: Insufficient documentation

## 2016-03-10 DIAGNOSIS — F17210 Nicotine dependence, cigarettes, uncomplicated: Secondary | ICD-10-CM | POA: Insufficient documentation

## 2016-03-10 DIAGNOSIS — S199XXA Unspecified injury of neck, initial encounter: Secondary | ICD-10-CM | POA: Insufficient documentation

## 2016-03-10 DIAGNOSIS — S4991XA Unspecified injury of right shoulder and upper arm, initial encounter: Secondary | ICD-10-CM | POA: Insufficient documentation

## 2016-03-10 DIAGNOSIS — M5430 Sciatica, unspecified side: Secondary | ICD-10-CM | POA: Insufficient documentation

## 2016-03-10 DIAGNOSIS — Y998 Other external cause status: Secondary | ICD-10-CM | POA: Insufficient documentation

## 2016-03-10 MED ORDER — CYCLOBENZAPRINE HCL 10 MG PO TABS: 10.0000 mg | ORAL_TABLET | Freq: Two times a day (BID) | ORAL | Status: DC | PRN

## 2016-03-10 MED ORDER — PREDNISONE 10 MG PO TABS: ORAL_TABLET | ORAL | Status: DC

## 2016-03-10 NOTE — ED Notes (Signed)
Pt fell off a ladder 5/2. Was seen here 5/3. Returns today for continued and worsening right shoulder and now, neck and upper back pain. Worse with any movement.

## 2016-03-10 NOTE — ED Provider Notes (Signed)
CSN: [id number]     Arrival date & time 03/10/16  [id number] History  By signing my name below, I, Freida Busmaniana Omoyeni, attest that this documentation has been prepared under the direction and in the presence of non-physician practitioner, Teressa LowerVrinda Larya Charpentier, NP. Electronically Signed: Freida Busmaniana Omoyeni, Scribe. 03/10/2016. 9:24 AM.    Chief Complaint  Patient presents with  . Neck Pain  . Shoulder Pain    The history is provided by the patient and medical records. No language interpreter was used.    HPI Comments:  Glen Morton is a 50 y.o. male who presents to the Emergency Department complaining of increased pain to his bilateral shoulders and neck x a few days. He describes his pain as "spams".  Pt states he fell from a ladder ~1 week ago and caught himself with his RUE. He notes the pain started in his neck and right shoulder/RUE but has now spread to the left shoulder. He denies numbness/weakness in his extremities.  Pt was seen in the ED on 03/04/16 for the same the day following initial injury. He had a negative XR of the right shoulder and was discharged with Robaxin and Naprosyn which he has been taking with little relief. No acute injury/fall.   Past Medical History  Diagnosis Date  . Sciatica    Past Surgical History  Procedure Laterality Date  . Leg surgery     No family history on file. Social History  Substance Use Topics  . Smoking status: Current Some Day Smoker    Types: Cigarettes  . Smokeless tobacco: Not on file  . Alcohol Use: No    Review of Systems  Musculoskeletal: Positive for arthralgias and neck pain.  Neurological: Negative for weakness and numbness.  All other systems reviewed and are negative.   Allergies  Review of patient's allergies indicates no known allergies.  Home Medications   Prior to Admission medications   Medication Sig Start Date End Date Taking? Authorizing Provider  methocarbamol (ROBAXIN) 500 MG tablet Take 2 tablets (1,000 mg total) by mouth 4  (four) times daily. 03/04/16   Renne CriglerJoshua Geiple, PA-C  naproxen (NAPROSYN) 500 MG tablet Take 1 tablet (500 mg total) by mouth 2 (two) times daily. 03/04/16   Renne CriglerJoshua Geiple, PA-C  traMADol (ULTRAM) 50 MG tablet Take 1 tablet (50 mg total) by mouth every 12 (twelve) hours as needed. 12/03/15   Tomasita CrumbleAdeleke Oni, MD   There were no vitals taken for this visit. Physical Exam  Constitutional: He is oriented to person, place, and time. He appears well-developed and well-nourished. No distress.  HENT:  Head: Normocephalic and atraumatic.  Eyes: Conjunctivae are normal.  Cardiovascular: Normal rate.   Pulmonary/Chest: Effort normal.  Abdominal: He exhibits no distension.  Musculoskeletal:  Bilateral cervical paraspinal tenderness. Bilateral strength equal. Good sensation. Full rom with pain  Neurological: He is alert and oriented to person, place, and time.  Skin: Skin is warm and dry.  Psychiatric: He has a normal mood and affect.  Nursing note and vitals reviewed.   ED Course  Procedures   DIAGNOSTIC STUDIES:  Oxygen Saturation is 98% on RA, normal by my interpretation.    COORDINATION OF CARE:  9:40 AM Discussed treatment plan with pt at bedside and pt agreed to plan.   MDM   Final diagnoses:  Muscle strain    Neurologically intact. Will treat with flexeril and steroids. Discussed follow up for continued symptoms  I personally performed the services described in this documentation, which was scribed  in my presence. The recorded information has been reviewed and is accurate.   Teressa Lower, NP 03/10/16 1610  Blane Ohara, MD 03/10/16 [phone]

## 2017-11-07 IMAGING — DX DG SHOULDER 2+V*R*
2 series · 2 of 2 positions shown · non-contrast
Comparison: None

CLINICAL DATA: Right upper extremity injury.

EXAM:
RIGHT SHOULDER - 2+ VIEW

[shoulder axillary]
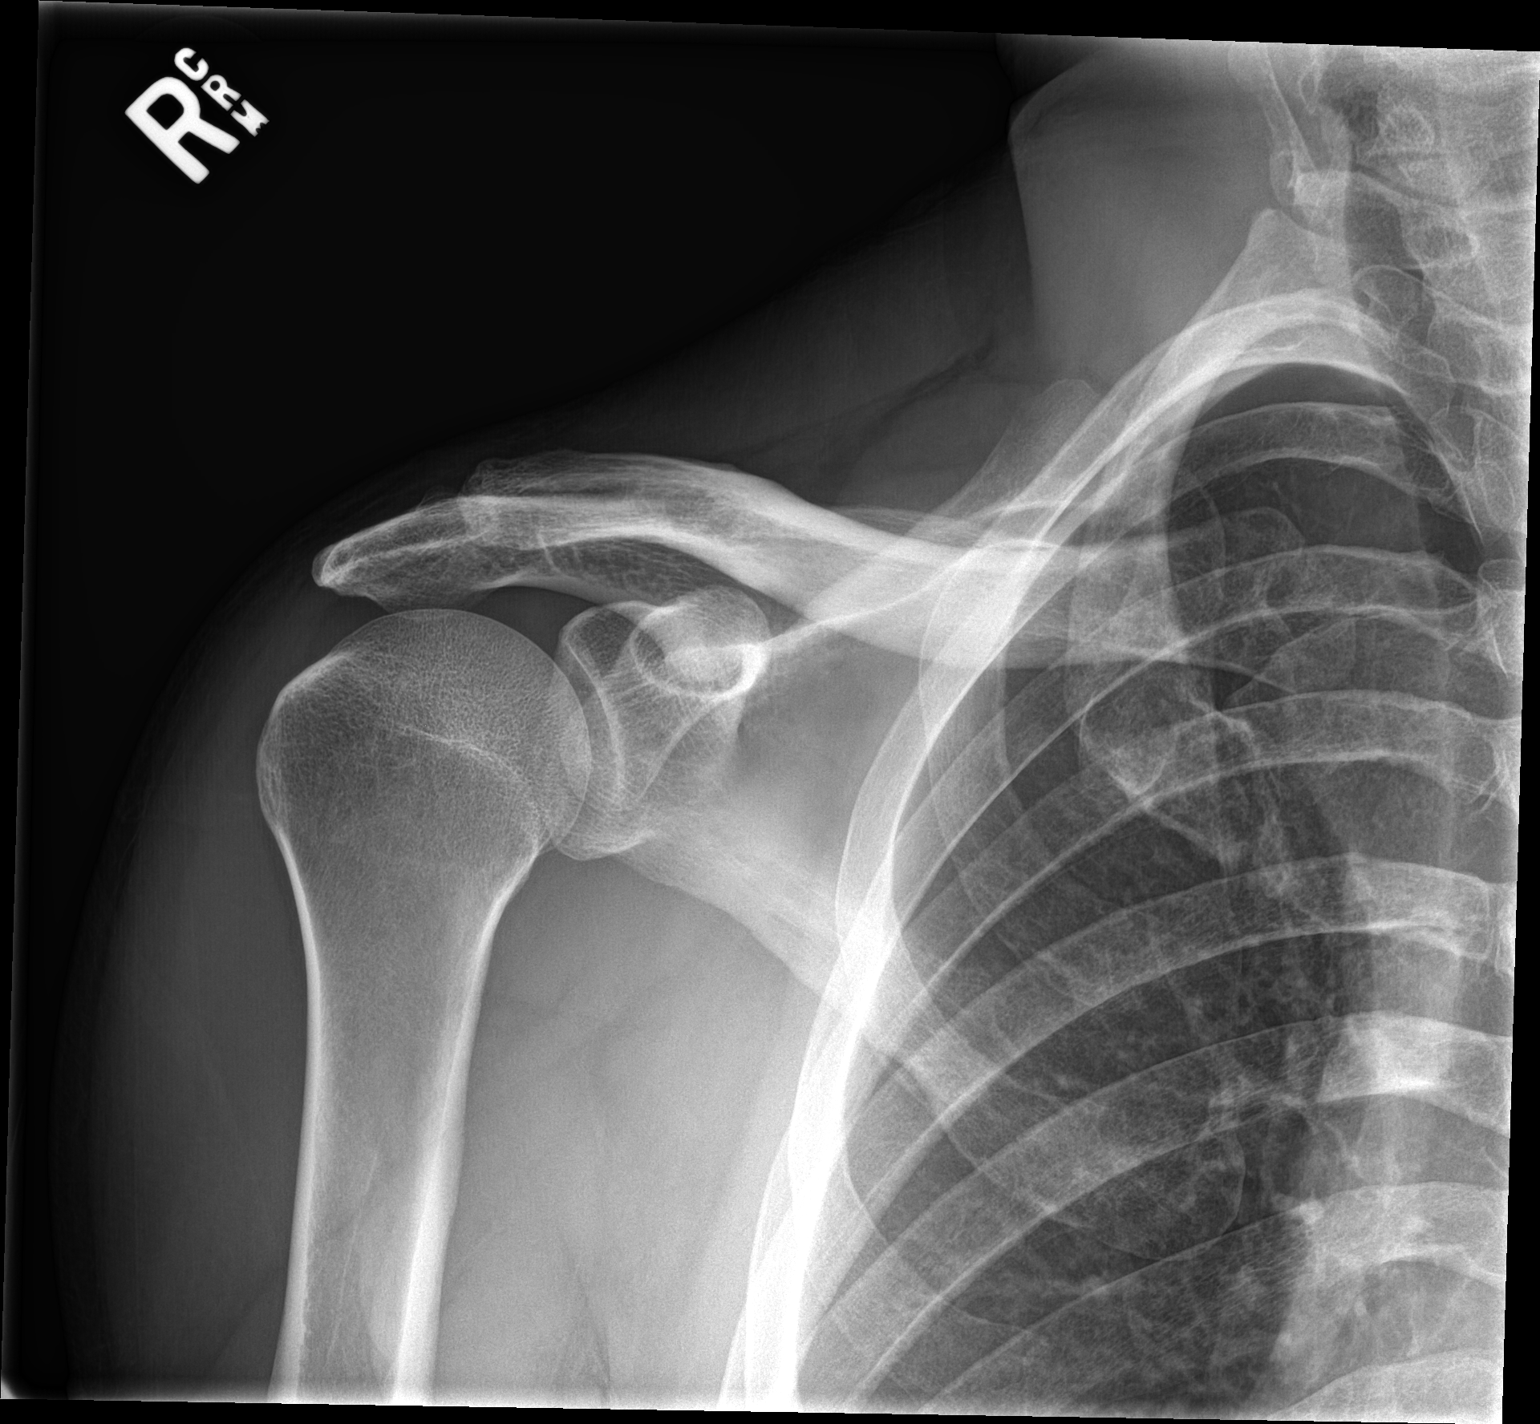

[shoulder y-view]
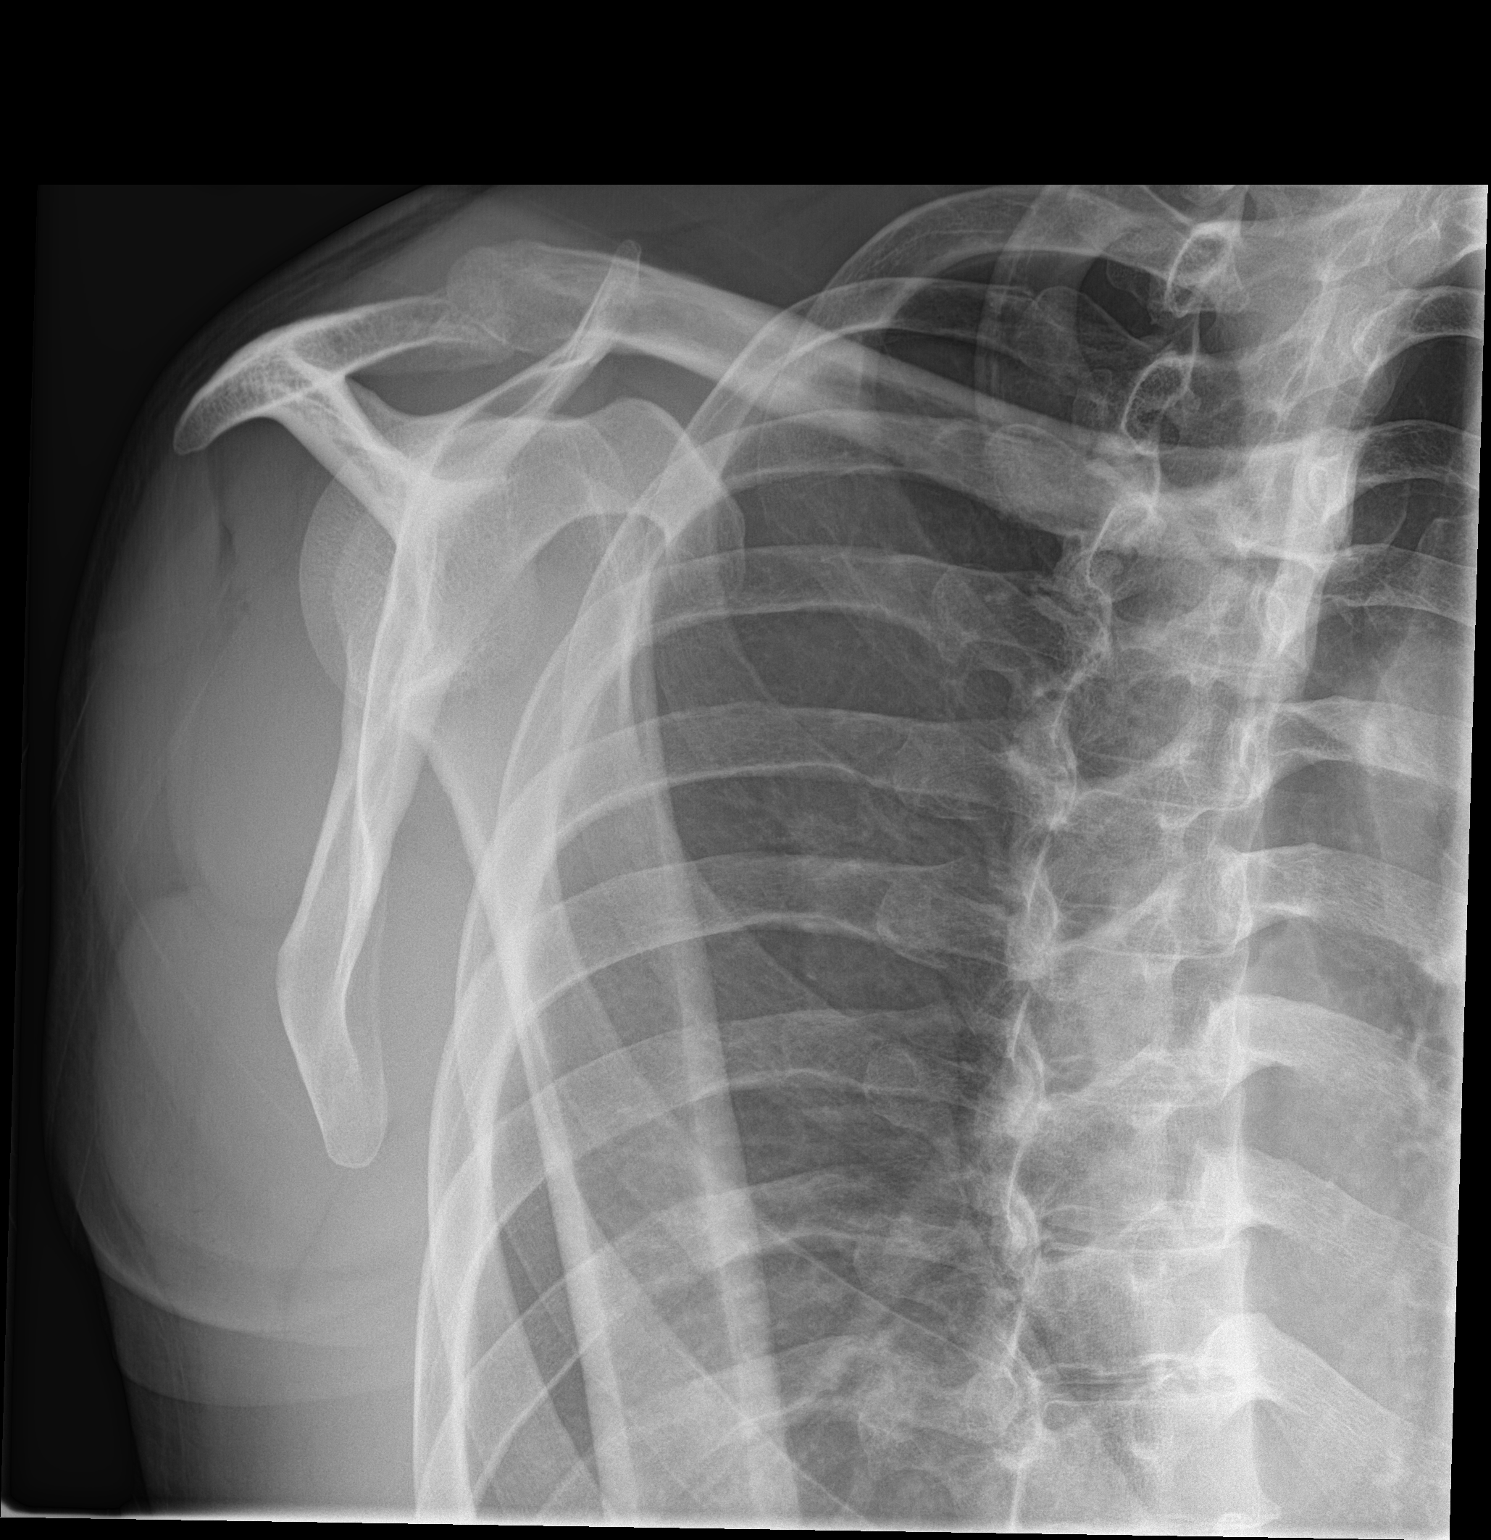

[2 of 2 positions shown; findings below may reference images not displayed]

FINDINGS: There is no evidence of fracture or dislocation. There is no
evidence of arthropathy or other focal bone abnormality. Soft
tissues are unremarkable.
IMPRESSION: Negative.

## 2023-11-02 ENCOUNTER — Ambulatory Visit (HOSPITAL_COMMUNITY)
Admission: EM | Admit: 2023-11-02 | Discharge: 2023-11-02 | Disposition: A | Discharge status: Home / Self Care | Payer: 59

## 2023-11-02 NOTE — ED Notes (Signed)
No answer from lobby  

## 2023-11-02 NOTE — ED Notes (Signed)
Called pt from lobby no answer X 1 

## 2023-11-09 ENCOUNTER — Emergency Department (HOSPITAL_COMMUNITY)
Admission: EM | Admit: 2023-11-09 | Discharge: 2023-11-09 | Disposition: A | Discharge status: Home / Self Care | Payer: 59 | Attending: Emergency Medicine | Admitting: Emergency Medicine

## 2023-11-09 ENCOUNTER — Other Ambulatory Visit: Payer: Self-pay

## 2023-11-09 DIAGNOSIS — M545 Low back pain, unspecified: Secondary | ICD-10-CM | POA: Diagnosis not present

## 2023-11-09 DIAGNOSIS — M62830 Muscle spasm of back: Secondary | ICD-10-CM | POA: Diagnosis not present

## 2023-11-09 MED ORDER — DIAZEPAM 2 MG PO TABS
2.0000 mg | ORAL_TABLET | Freq: Once | ORAL | Status: AC
  Administered 2023-11-09: 2 mg via ORAL
  Filled 2023-11-09: qty 1

## 2023-11-09 MED ORDER — HYDROCODONE-ACETAMINOPHEN 5-325 MG PO TABS
1.0000 | ORAL_TABLET | Freq: Once | ORAL | Status: AC
  Administered 2023-11-09: 1 via ORAL
  Filled 2023-11-09: qty 1

## 2023-11-09 MED ORDER — METHOCARBAMOL 500 MG PO TABS: 500.0000 mg | ORAL_TABLET | Freq: Two times a day (BID) | ORAL | 0 refills | Status: DC

## 2023-11-09 MED ORDER — KETOROLAC TROMETHAMINE 15 MG/ML IJ SOLN
15.0000 mg | Freq: Once | INTRAMUSCULAR | Status: AC
  Administered 2023-11-09: 15 mg via INTRAMUSCULAR
  Filled 2023-11-09: qty 1

## 2023-11-09 MED ORDER — LIDOCAINE 5 % EX PTCH
1.0000 | MEDICATED_PATCH | CUTANEOUS | Status: DC
  Administered 2023-11-09: 1 via TRANSDERMAL
  Filled 2023-11-09: qty 1

## 2023-11-09 MED ORDER — ONDANSETRON 4 MG PO TBDP
4.0000 mg | ORAL_TABLET | Freq: Once | ORAL | Status: AC
  Administered 2023-11-09: 4 mg via ORAL
  Filled 2023-11-09: qty 1

## 2023-11-09 NOTE — Discharge Instructions (Signed)
 Your exam was overall reassuring.  Consistent with muscle spasm.  You received multiple medications in the emergency department.  I have prescribed you Robaxin .  This medication will make you drowsy.  Do not drive or do anything else dangerous after taking it.  If you have any concerning symptoms such as inability to control your bowel or bladder, numbness or tingling in your groin, fever without any other source in addition the back pain return to the emergency room.  Otherwise follow-up with your primary care provider.

## 2023-11-09 NOTE — ED Notes (Signed)
 Pt verbalized understanding of discharge instructions. Pt ambulated from ed with family. Family to drive home

## 2023-11-09 NOTE — ED Provider Notes (Signed)
 [place] [facility] Provider Note   CSN: [id number] Arrival date & time: 11/09/23  9162     History  Chief Complaint  Patient presents with   Back Pain    Glen Morton is a 58 y.o. male.  58 year old male presents today for concern of low back pain.  Ongoing for about a week and a half.  Concentrated on the right lower back.  Radiates down the lower back as well.  States he has had something similar in the past.  He states he was getting out of the truck and felt it getting tight which progressively worsened the rest of that day.  He has not tried anything prior to arrival.  Denies bowel or bladder incontinence, saddle anesthesia, fever.  The history is provided by the patient. No language interpreter was used.       Home Medications Prior to Admission medications   Medication Sig Start Date End Date Taking? Authorizing Provider  cyclobenzaprine  (FLEXERIL ) 10 MG tablet Take 1 tablet (10 mg total) by mouth 2 (two) times daily as needed for muscle spasms. 03/10/16   Pickering, Vrinda, NP  methocarbamol  (ROBAXIN ) 500 MG tablet Take 2 tablets (1,000 mg total) by mouth 4 (four) times daily. 03/04/16   Geiple, Joshua, PA-C  naproxen  (NAPROSYN ) 500 MG tablet Take 1 tablet (500 mg total) by mouth 2 (two) times daily. 03/04/16   Geiple, Joshua, PA-C  predniSONE  (DELTASONE ) 10 MG tablet 6 day step down dose 03/10/16   Pickering, Vrinda, NP  traMADol  (ULTRAM ) 50 MG tablet Take 1 tablet (50 mg total) by mouth every 12 (twelve) hours as needed. 12/03/15   Carlota Day, MD      Allergies    Patient has no known allergies.    Review of Systems   Review of Systems  Constitutional:  Negative for chills and fever.  Genitourinary:  Negative for difficulty urinating.  Musculoskeletal:  Positive for back pain. Negative for myalgias.  All other systems reviewed and are negative.   Physical Exam Updated Vital Signs BP (!) 145/82 (BP Location: Right Arm)   Pulse 70    Temp 98.2 F (36.8 C)   Resp 18   Ht 6' 2 (1.88 m)   Wt 89.8 kg   SpO2 98%   BMI 25.42 kg/m  Physical Exam Vitals and nursing note reviewed.  Constitutional:      General: He is not in acute distress.    Appearance: Normal appearance. He is not ill-appearing.  HENT:     Head: Normocephalic and atraumatic.     Nose: Nose normal.  Eyes:     Conjunctiva/sclera: Conjunctivae normal.  Cardiovascular:     Rate and Rhythm: Normal rate.  Pulmonary:     Effort: Pulmonary effort is normal. No respiratory distress.  Abdominal:     General: There is no distension.     Palpations: Abdomen is soft.  Musculoskeletal:        General: No deformity. Normal range of motion.     Cervical back: Normal range of motion.     Comments: Tenderness/spasm noted over the right lower lumbar paraspinal muscles.  No spinal process tenderness palpation.  Spine well aligned.  Good range of motion bilateral lower extremities.  Skin:    Findings: No rash.  Neurological:     Mental Status: He is alert.     ED Results / Procedures / Treatments   Labs (all labs ordered are listed, but only abnormal results are  displayed) Labs Reviewed - No data to display  EKG None  Radiology No results found.  Procedures Procedures    Medications Ordered in ED Medications - No data to display  ED Course/ Medical Decision Making/ A&P                                 Medical Decision Making  Medical Decision Making / ED Course   This patient presents to the ED for concern of low back pain, this involves an extensive number of treatment options, and is a complaint that carries with it a high risk of complications and morbidity.  The differential diagnosis includes muscle spasm, muscle strain, fracture however low suspicion for fracture given no spinal process tenderness to palpation.  Also no trauma.  MDM: 58 year old male presents today for concern of muscle spasm.  Exam overall reassuring.  No red flag  signs or symptoms on history or exam that would raise suspicion for cauda equina syndrome or spinal epidural abscess.  Will give multimodal pain control in the emergency department and discharged with Robaxin .  Patient is in agreement with plan.  Discharged in stable condition.   Lab Tests: -I ordered, reviewed, and interpreted labs.   The pertinent results include:   Labs Reviewed - No data to display    EKG  EKG Interpretation Date/Time:    Ventricular Rate:    PR Interval:    QRS Duration:    QT Interval:    QTC Calculation:   R Axis:      Text Interpretation:           Medicines ordered and prescription drug management: No orders of the defined types were placed in this encounter.   -I have reviewed the patients home medicines and have made adjustments as needed   Reevaluation: After the interventions noted above, I reevaluated the patient and found that they have :stayed the same  Co morbidities that complicate the patient evaluation  Past Medical History:  Diagnosis Date   Sciatica       Dispostion: Discharged in stable condition.  Return precaution discussed.  Patient voices understanding and is in agreement with plan.   Final Clinical Impression(s) / ED Diagnoses Final diagnoses:  Muscle spasm of back    Rx / DC Orders ED Discharge Orders          Ordered    methocarbamol  (ROBAXIN ) 500 MG tablet  2 times daily        11/09/23 1026              Hildegard Loge, PA-C 11/09/23 1026    Charlyn Sora, MD 11/10/23 1327

## 2023-11-09 NOTE — ED Triage Notes (Signed)
 Pt. Stated, I've had back pain for a week. Its to the point I can't hardly stand it.

## 2024-05-26 ENCOUNTER — Other Ambulatory Visit: Payer: Self-pay

## 2024-05-26 ENCOUNTER — Emergency Department (HOSPITAL_COMMUNITY)
Admission: EM | Admit: 2024-05-26 | Discharge: 2024-05-26 | Disposition: A | Discharge status: Home / Self Care | Payer: Self-pay | Attending: Emergency Medicine | Admitting: Emergency Medicine

## 2024-05-26 DIAGNOSIS — M545 Low back pain, unspecified: Secondary | ICD-10-CM

## 2024-05-26 DIAGNOSIS — H6122 Impacted cerumen, left ear: Secondary | ICD-10-CM | POA: Insufficient documentation

## 2024-05-26 DIAGNOSIS — M5440 Lumbago with sciatica, unspecified side: Secondary | ICD-10-CM | POA: Insufficient documentation

## 2024-05-26 DIAGNOSIS — H6120 Impacted cerumen, unspecified ear: Secondary | ICD-10-CM

## 2024-05-26 MED ORDER — LIDOCAINE 5 % EX PTCH
1.0000 | MEDICATED_PATCH | CUTANEOUS | Status: DC
  Administered 2024-05-26: 1 via TRANSDERMAL
  Filled 2024-05-26: qty 1

## 2024-05-26 MED ORDER — IBUPROFEN 400 MG PO TABS
600.0000 mg | ORAL_TABLET | Freq: Once | ORAL | Status: AC
  Administered 2024-05-26: 600 mg via ORAL
  Filled 2024-05-26: qty 1

## 2024-05-26 NOTE — ED Notes (Signed)
 Patient provided with discharge paperwork. Pt demonstrated understanding of material. All questions, comments, and concerns addressed. Pt ambulated in hallway towards exit with no complications

## 2024-05-26 NOTE — ED Triage Notes (Signed)
 Pt. Stated, I started having lower back pain for 2 weeks its probably my sciatica  and I tried to get wax out of right ear and its sort of stuck there and I can't hardly hear.

## 2024-05-26 NOTE — ED Provider Notes (Signed)
 [place] [facility] Provider Note   CSN: [id number] Arrival date & time: 05/26/24  9271     Patient presents with: Back Pain and Cerumen Impaction  HPI Glen Morton is a 58 y.o. male presenting for lower back pain and earwax issue.  States lower back pain started about 2 weeks ago.  All about the lower back.  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  Shortly after that he started develop pain in his back.  It is worse with movement.  Does report a history of sciatica.  Denies urinary or bowel changes, saddle anesthesia, IV drug use and fever or any known trauma to his back.  States he has not been taking any medications for his symptoms.  Mentioned that he has had earwax buildup in his right ear.  He states he was able to get some out yesterday but still thinks there is some earwax and requesting treatment.  Denies tinnitus, otalgia and hearing loss.    Back Pain      Prior to Admission medications   Medication Sig Start Date End Date Taking? Authorizing Provider  cyclobenzaprine  (FLEXERIL ) 10 MG tablet Take 1 tablet (10 mg total) by mouth 2 (two) times daily as needed for muscle spasms. 03/10/16   Pickering, Vrinda, NP  methocarbamol  (ROBAXIN ) 500 MG tablet Take 1 tablet (500 mg total) by mouth 2 (two) times daily. 11/09/23   Hildegard Loge, PA-C  naproxen  (NAPROSYN ) 500 MG tablet Take 1 tablet (500 mg total) by mouth 2 (two) times daily. 03/04/16   Geiple, Joshua, PA-C  predniSONE  (DELTASONE ) 10 MG tablet 6 day step down dose 03/10/16   Pickering, Vrinda, NP  traMADol  (ULTRAM ) 50 MG tablet Take 1 tablet (50 mg total) by mouth every 12 (twelve) hours as needed. 12/03/15   Carlota Day, MD    Allergies: Patient has no known allergies.    Review of Systems  Musculoskeletal:  Positive for back pain.    Updated Vital Signs BP (!) 147/82 (BP Location: Right Arm)   Pulse 67   Temp 98.1 F (36.7 C)  (Oral)   Resp 16   Ht 6' 2 (1.88 m)   Wt 85.7 kg   SpO2 99%   BMI 24.27 kg/m   Physical Exam Constitutional:      Appearance: Normal appearance.  HENT:     Head: Normocephalic.     Right Ear: No swelling or tenderness. No middle ear effusion. No foreign body.     Left Ear: No swelling or tenderness.  No middle ear effusion. There is impacted cerumen.     Nose: Nose normal.  Eyes:     Conjunctiva/sclera: Conjunctivae normal.  Pulmonary:     Effort: Pulmonary effort is normal.  Abdominal:     General: There is no distension.     Palpations: Abdomen is soft.     Tenderness: There is no abdominal tenderness. There is no right CVA tenderness or left CVA tenderness.  Musculoskeletal:     Lumbar back: Normal. No swelling, edema, lacerations, spasms or tenderness. Normal range of motion.  Neurological:     Mental Status: He is alert.  Psychiatric:        Mood and Affect: Mood normal.     (all labs ordered are listed, but only abnormal results are displayed) Labs Reviewed - No data to display  EKG: None  Radiology: No results found.   Procedures  Medications Ordered in the ED  lidocaine  (LIDODERM ) 5 % 1 patch (has no administration in time range)  ibuprofen  (ADVIL ) tablet 600 mg (has no administration in time range)                                    Medical Decision Making Risk Prescription drug management.  58 year old well-appearing male presenting for lower back pain and earwax problem.  Exam notable for cerumen impaction in the left ear but otherwise reassuring.  Suspect the back pain is likely musculoskeletal in etiology.  Advised conservative treatment at home with NSAIDs.  There was some residual earwax in the right ear as well.  Nurse was able to irrigate both ears and marked improvement on reassessment noted bilateral patent ear canals.  Advised to follow-up with PCP.  Discharged good condition.      Final diagnoses:  Impacted cerumen, unspecified  laterality  Low back pain, unspecified back pain laterality, unspecified chronicity, unspecified whether sciatica present    ED Discharge Orders     None          Lang Norleen POUR, PA-C 05/26/24 9073    Dasie Faden, MD 05/28/24 1300

## 2024-05-26 NOTE — Discharge Instructions (Addendum)
 Evaluation today was overall reassuring.  For your back as we discussed recommend applying ice 3-4 times a day for the next 4 days for 20 minutes each time.  Also he did have some earwax in both ears, worse in the left ear.  Earwax removal went well here.  Please follow-up with your PCP.  You can also take ibuprofen  and Tylenol  for your back pain as well.

## 2024-05-27 ENCOUNTER — Emergency Department (HOSPITAL_COMMUNITY)
Admission: EM | Admit: 2024-05-27 | Discharge: 2024-05-27 | Disposition: A | Discharge status: Home / Self Care | Payer: Self-pay | Attending: Emergency Medicine | Admitting: Emergency Medicine

## 2024-05-27 ENCOUNTER — Other Ambulatory Visit: Payer: Self-pay

## 2024-05-27 ENCOUNTER — Encounter (HOSPITAL_COMMUNITY): Payer: Self-pay

## 2024-05-27 DIAGNOSIS — M5442 Lumbago with sciatica, left side: Secondary | ICD-10-CM | POA: Insufficient documentation

## 2024-05-27 DIAGNOSIS — M5432 Sciatica, left side: Secondary | ICD-10-CM

## 2024-05-27 MED ORDER — DIAZEPAM 5 MG PO TABS
5.0000 mg | ORAL_TABLET | Freq: Once | ORAL | Status: AC
  Administered 2024-05-27: 5 mg via ORAL
  Filled 2024-05-27: qty 1

## 2024-05-27 MED ORDER — KETOROLAC TROMETHAMINE 30 MG/ML IJ SOLN
30.0000 mg | Freq: Once | INTRAMUSCULAR | Status: AC
  Administered 2024-05-27: 30 mg via INTRAMUSCULAR
  Filled 2024-05-27: qty 1

## 2024-05-27 MED ORDER — METHYLPREDNISOLONE 4 MG PO TBPK: ORAL_TABLET | Freq: Every day | ORAL | 0 refills | Status: DC

## 2024-05-27 MED ORDER — OXYCODONE-ACETAMINOPHEN 5-325 MG PO TABS
ORAL_TABLET | ORAL | Status: AC
  Filled 2024-05-27: qty 1

## 2024-05-27 MED ORDER — HYDROCODONE-ACETAMINOPHEN 5-325 MG PO TABS: 1.0000 | ORAL_TABLET | ORAL | 0 refills | Status: DC | PRN

## 2024-05-27 MED ORDER — DEXAMETHASONE SODIUM PHOSPHATE 10 MG/ML IJ SOLN
10.0000 mg | Freq: Once | INTRAMUSCULAR | Status: AC
  Administered 2024-05-27: 10 mg via INTRAMUSCULAR
  Filled 2024-05-27: qty 1

## 2024-05-27 MED ORDER — OXYCODONE-ACETAMINOPHEN 5-325 MG PO TABS
1.0000 | ORAL_TABLET | ORAL | Status: DC | PRN
  Administered 2024-05-27: 1 via ORAL

## 2024-05-27 MED ORDER — DIAZEPAM 5 MG PO TABS: 5.0000 mg | ORAL_TABLET | Freq: Two times a day (BID) | ORAL | 0 refills | Status: DC | PRN

## 2024-05-27 NOTE — ED Provider Notes (Signed)
[place] EMERGENCY DEPARTMENT AT [facility] Provider Note   CSN: [id number] Arrival date & time: 05/27/24  1358     Patient presents with: Back Pain and Leg Pain (left)   Glen Morton is a 58 y.o. male.   Pt is a 58 yo male with pmhx significant for back pain.  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  Pt said his pain has been worse.  He came in yesterday and was not given anything for home.  He said the pain came back and he could not even stand up.  He has pain radiating down his left leg, but no neuro sx.       Prior to Admission medications   Medication Sig Start Date End Date Taking? Authorizing Provider  diazepam  (VALIUM ) 5 MG tablet Take 1 tablet (5 mg total) by mouth every 12 (twelve) hours as needed for muscle spasms. 05/27/24  Yes Dean Clarity, MD  HYDROcodone -acetaminophen  (NORCO/VICODIN) 5-325 MG tablet Take 1 tablet by mouth every 4 (four) hours as needed. 05/27/24  Yes Dean Clarity, MD  methylPREDNISolone  (MEDROL  DOSEPAK) 4 MG TBPK tablet Take by mouth daily. Day 1:  2 pills at breakfast, 1 pill at lunch, 1 pill after supper, 2 pills at bedtime;Day 2:  1 pill at breakfast, 1 pill at lunch, 1 pill after supper, 2 pills at bedtime;Day 3:  1 pill at breakfast, 1 pill at lunch, 1 pill after supper, 1 pill at bedtime;Day 4:  1 pill at breakfast, 1 pill at lunch, 1 pill at bedtime;Day 5:  1 pill at breakfast, 1 pill at bedtime;Day 6:  1 pill at breakfast 05/27/24  Yes Dean Clarity, MD  cyclobenzaprine  (FLEXERIL ) 10 MG tablet Take 1 tablet (10 mg total) by mouth 2 (two) times daily as needed for muscle spasms. 03/10/16   Pickering, Vrinda, NP  methocarbamol  (ROBAXIN ) 500 MG tablet Take 1 tablet (500 mg total) by mouth 2 (two) times daily. 11/09/23   Hildegard, Amjad, PA-C  naproxen  (NAPROSYN ) 500 MG tablet Take 1 tablet (500 mg total) by mouth 2 (two) times daily. 03/04/16   Geiple, Joshua, PA-C  predniSONE  (DELTASONE ) 10 MG tablet 6 day step down  dose 03/10/16   Pickering, Vrinda, NP  traMADol  (ULTRAM ) 50 MG tablet Take 1 tablet (50 mg total) by mouth every 12 (twelve) hours as needed. 12/03/15   Carlota Day, MD    Allergies: Patient has no known allergies.    Review of Systems  Musculoskeletal:  Positive for back pain.  All other systems reviewed and are negative.   Updated Vital Signs BP (!) 157/89 (BP Location: Right Arm)   Pulse (!) 51   Temp 97.6 F (36.4 C) (Oral)   Resp 16   SpO2 100%   Physical Exam Vitals and nursing note reviewed.  Constitutional:      Appearance: Normal appearance.  HENT:     Head: Normocephalic and atraumatic.     Right Ear: External ear normal.     Left Ear: External ear normal.     Nose: Nose normal.     Mouth/Throat:     Mouth: Mucous membranes are moist.     Pharynx: Oropharynx is clear.  Eyes:     Extraocular Movements: Extraocular movements intact.     Conjunctiva/sclera: Conjunctivae normal.     Pupils: Pupils are equal, round, and reactive to light.  Cardiovascular:     Rate and Rhythm: Normal rate and regular rhythm.  Pulses: Normal pulses.     Heart sounds: Normal heart sounds.  Pulmonary:     Effort: Pulmonary effort is normal.     Breath sounds: Normal breath sounds.  Abdominal:     General: Abdomen is flat. Bowel sounds are normal.     Palpations: Abdomen is soft.  Musculoskeletal:       Arms:     Cervical back: Normal range of motion and neck supple.     Lumbar back: Positive left straight leg raise test.       Back:  Skin:    Capillary Refill: Capillary refill takes less than 2 seconds.  Neurological:     General: No focal deficit present.     Mental Status: He is alert and oriented to person, place, and time.  Psychiatric:        Mood and Affect: Mood normal.        Behavior: Behavior normal.     (all labs ordered are listed, but only abnormal results are displayed) Labs Reviewed - No data to display  EKG: None  Radiology: No results  found.   Procedures   Medications Ordered in the ED  oxyCODONE -acetaminophen  (PERCOCET/ROXICET) 5-325 MG per tablet 1 tablet (1 tablet Oral Given 05/27/24 1416)  ketorolac  (TORADOL ) 30 MG/ML injection 30 mg (30 mg Intramuscular Given 05/27/24 1639)  dexamethasone  (DECADRON ) injection 10 mg (10 mg Intramuscular Given 05/27/24 1639)  diazepam  (VALIUM ) tablet 5 mg (5 mg Oral Given 05/27/24 1639)                                    Medical Decision Making Risk Prescription drug management.   This patient presents to the ED for concern of back pain, this involves an extensive number of treatment options, and is a complaint that carries with it a high risk of complications and morbidity.  The differential diagnosis includes sciatica, shingles, msk   Co morbidities that complicate the patient evaluation  Chronic back pain   Additional history obtained:  Additional history obtained from epic chart review External records from outside source obtained and reviewed including wife   Medicines ordered and prescription drug management:  I ordered medication including toradol /decadron   for sx  Reevaluation of the patient after these medicines showed that the patient improved I have reviewed the patients home medicines and have made adjustments as needed  Problem List / ED Course:  Sciatica:  no red flag sx.  Pt is stable for d/c.  He knows to return if worse.  He is to f/u with ortho. Social Determinants of Health:  No pcp/self pay   Dispostion:  After consideration of the diagnostic results and the patients response to treatment, I feel that the patent would benefit from discharge with outpatient f/u.       Final diagnoses:  Sciatica of left side    ED Discharge Orders          Ordered    HYDROcodone -acetaminophen  (NORCO/VICODIN) 5-325 MG tablet  Every 4 hours PRN        05/27/24 1628    methylPREDNISolone  (MEDROL  DOSEPAK) 4 MG TBPK tablet  Daily        05/27/24 1628     diazepam  (VALIUM ) 5 MG tablet  Every 12 hours PRN        05/27/24 1628               Eros Montour, MD  05/27/24 1818  

## 2024-05-27 NOTE — ED Triage Notes (Signed)
 Pt to ED c/o lower back pain that radiates down left leg x 2 weeks, progressively getting worse. Evaluated yesterday in the ED for the same. Hx sciatica.

## 2024-05-29 ENCOUNTER — Other Ambulatory Visit: Payer: Self-pay

## 2024-05-29 ENCOUNTER — Emergency Department (HOSPITAL_COMMUNITY)
Admission: EM | Admit: 2024-05-29 | Discharge: 2024-05-29 | Disposition: A | Discharge status: Home / Self Care | Payer: Self-pay | Attending: Emergency Medicine | Admitting: Emergency Medicine

## 2024-05-29 ENCOUNTER — Encounter (HOSPITAL_COMMUNITY): Payer: Self-pay

## 2024-05-29 DIAGNOSIS — G8929 Other chronic pain: Secondary | ICD-10-CM | POA: Insufficient documentation

## 2024-05-29 DIAGNOSIS — Z79899 Other long term (current) drug therapy: Secondary | ICD-10-CM | POA: Insufficient documentation

## 2024-05-29 DIAGNOSIS — M5442 Lumbago with sciatica, left side: Secondary | ICD-10-CM | POA: Insufficient documentation

## 2024-05-29 DIAGNOSIS — M5417 Radiculopathy, lumbosacral region: Secondary | ICD-10-CM | POA: Insufficient documentation

## 2024-05-29 MED ORDER — GABAPENTIN 100 MG PO CAPS: 100.0000 mg | ORAL_CAPSULE | Freq: Three times a day (TID) | ORAL | 0 refills | Status: DC

## 2024-05-29 MED ORDER — DICLOFENAC SODIUM 50 MG PO TBEC: 50.0000 mg | DELAYED_RELEASE_TABLET | Freq: Three times a day (TID) | ORAL | 0 refills | Status: DC

## 2024-05-29 MED ORDER — HYDROCODONE-ACETAMINOPHEN 5-325 MG PO TABS: 1.0000 | ORAL_TABLET | Freq: Four times a day (QID) | ORAL | 0 refills | Status: AC | PRN

## 2024-05-29 NOTE — ED Notes (Signed)
 Patient exited the ED with VSS and NAD. Discharge instructions reviewed and pt verbalized understanding.

## 2024-05-29 NOTE — ED Triage Notes (Signed)
 Pt arrives POV c/o lower back pain radiating into left leg for 2 weeks. That started while he was sitting on a bucket at work about 2.5 weeks ago. Was seen last week for same.

## 2024-05-29 NOTE — ED Provider Notes (Signed)
 [place] EMERGENCY DEPARTMENT AT [facility] Provider Note   CSN: [id number] Arrival date & time: 05/29/24  9191    Patient presents with: Back Pain   Glen Morton is a 58 y.o. male with no pertinent PMH. He does report that he has had back pain and what he describes as spasms for years. Last week, he was sitting at work, stood up quickly and has since has severe back pain with radiation into the legs. This is his 3rd ED trip in the last week. He describes the pain as radiating from the back around the hip and to the inside of the left knee. He also reports that when the pain is severe enough, he feels as if his muscles give out. He has taken Norco, decadron , and valium  as prescribed without relief. He has not tried gabapentin  or cymbalta.   Back Pain Associated symptoms: leg pain   Associated symptoms: no abdominal pain, no bladder incontinence, no bowel incontinence, no chest pain, no fever, no numbness, no paresthesias, no perianal numbness and no weakness        Prior to Admission medications   Medication Sig Start Date End Date Taking? Authorizing Provider  diclofenac  (VOLTAREN ) 50 MG EC tablet Take 1 tablet (50 mg total) by mouth 3 (three) times daily. 05/29/24  Yes Myrna Bitters, DO  gabapentin  (NEURONTIN ) 100 MG capsule Take 1 capsule (100 mg total) by mouth 3 (three) times daily. 05/29/24 06/28/24 Yes Pamella Ozell LABOR, DO  cyclobenzaprine  (FLEXERIL ) 10 MG tablet Take 1 tablet (10 mg total) by mouth 2 (two) times daily as needed for muscle spasms. 03/10/16   Pickering, Vrinda, NP  diazepam  (VALIUM ) 5 MG tablet Take 1 tablet (5 mg total) by mouth every 12 (twelve) hours as needed for muscle spasms. 05/27/24   Dean Clarity, MD  HYDROcodone -acetaminophen  (NORCO/VICODIN) 5-325 MG tablet Take 1 tablet by mouth every 6 (six) hours as needed for up to 3 days. 05/29/24 06/01/24  Pamella Ozell LABOR, DO  methocarbamol  (ROBAXIN ) 500 MG tablet Take 1 tablet (500 mg total) by mouth 2 (two)  times daily. 11/09/23   Hildegard Loge, PA-C  methylPREDNISolone  (MEDROL  DOSEPAK) 4 MG TBPK tablet Take by mouth daily. Day 1:  2 pills at breakfast, 1 pill at lunch, 1 pill after supper, 2 pills at bedtime;Day 2:  1 pill at breakfast, 1 pill at lunch, 1 pill after supper, 2 pills at bedtime;Day 3:  1 pill at breakfast, 1 pill at lunch, 1 pill after supper, 1 pill at bedtime;Day 4:  1 pill at breakfast, 1 pill at lunch, 1 pill at bedtime;Day 5:  1 pill at breakfast, 1 pill at bedtime;Day 6:  1 pill at breakfast 05/27/24   Haviland, Julie, MD  predniSONE  (DELTASONE ) 10 MG tablet 6 day step down dose 03/10/16   Pickering, Vrinda, NP  traMADol  (ULTRAM ) 50 MG tablet Take 1 tablet (50 mg total) by mouth every 12 (twelve) hours as needed. 12/03/15   Carlota Day, MD    Allergies: Patient has no known allergies.    Review of Systems  Constitutional:  Positive for activity change. Negative for chills and fever.  Respiratory:  Negative for shortness of breath.   Cardiovascular:  Negative for chest pain and leg swelling.  Gastrointestinal:  Negative for abdominal pain, bowel incontinence, nausea and vomiting.  Genitourinary:  Negative for bladder incontinence.  Musculoskeletal:  Positive for back pain.  Neurological:  Negative for weakness, numbness and paresthesias.  Denies loss of bowel and bladder.    Updated Vital Signs BP (!) 148/84 (BP Location: Right Arm)   Pulse 70   Temp 98.2 F (36.8 C) (Oral)   Resp 17   SpO2 100%   Physical Exam HENT:     Head: Normocephalic and atraumatic.  Cardiovascular:     Rate and Rhythm: Normal rate and regular rhythm.     Heart sounds: No murmur heard.    No friction rub. No gallop.  Pulmonary:     Effort: Pulmonary effort is normal.     Breath sounds: Normal breath sounds.  Musculoskeletal:        General: No swelling or tenderness.     Cervical back: No rigidity or tenderness.     Comments: No midline spinal tenderness  Skin:    General: Skin is warm  and dry.  Neurological:     General: No focal deficit present.     Mental Status: He is alert.  Psychiatric:        Mood and Affect: Mood normal.     (all labs ordered are listed, but only abnormal results are displayed) Labs Reviewed - No data to display  EKG: None  Radiology: No results found.   Medications Ordered in the ED - No data to display                                Medical Decision Making Risk Prescription drug management.   Back Pain Radiculopathy Pt with history of chronic back pain, however it has worsened over the past week with three visits to the ED for pain control. Now complaining of radicular symptoms in the left leg. Will d/c with gabapentin  100mg  TID, diclofenac  50mg  TID and advised pt to follow up with pain medicine and to attempt PT.    Final diagnoses:  Lumbosacral radiculopathy  Chronic bilateral low back pain with left-sided sciatica    ED Discharge Orders          Ordered    diclofenac  (VOLTAREN ) 50 MG EC tablet  3 times daily        05/29/24 0935    gabapentin  (NEURONTIN ) 100 MG capsule  3 times daily        05/29/24 0938    HYDROcodone -acetaminophen  (NORCO/VICODIN) 5-325 MG tablet  Every 6 hours PRN        05/29/24 0938               Sterlin Knightly, DO 05/29/24 0938    Pamella Ozell LABOR, DO 06/10/24 1628

## 2024-05-29 NOTE — Discharge Instructions (Addendum)
 Mr. Bogden was seen in the ED for lower back pain radiating into the left leg. He was prescribed: Diclofenac  (NSAID): take 1 tablet every 8 hours as needed for pain Do not take with ibuprofen  as these are the same class of medication. Hydrocodone -Acetaminophen : Take 1 tablet every 6 hours as needed for pain Gabapentin  100mg : take 1 tablet every 8 hours.  We have provided you with a number for a physical therapy office to call and establish care with as this can help greatly with sciatic back pain Please follow up with your PCP as scheduled and return to the ED if you notice weakness, numbness, or loss of bowel/bladder control.

## 2024-06-27 ENCOUNTER — Ambulatory Visit: Payer: Self-pay | Admitting: Family Medicine

## 2024-07-22 ENCOUNTER — Other Ambulatory Visit: Payer: Self-pay

## 2024-07-22 ENCOUNTER — Emergency Department (HOSPITAL_COMMUNITY)

## 2024-07-22 ENCOUNTER — Emergency Department (HOSPITAL_COMMUNITY)
Admission: EM | Admit: 2024-07-22 | Discharge: 2024-07-22 | Disposition: A | Discharge status: Home / Self Care | Attending: Emergency Medicine | Admitting: Emergency Medicine

## 2024-07-22 ENCOUNTER — Encounter (HOSPITAL_COMMUNITY): Payer: Self-pay

## 2024-07-22 DIAGNOSIS — N3001 Acute cystitis with hematuria: Secondary | ICD-10-CM | POA: Diagnosis not present

## 2024-07-22 DIAGNOSIS — R319 Hematuria, unspecified: Secondary | ICD-10-CM | POA: Diagnosis present

## 2024-07-22 LAB — URINALYSIS, ROUTINE W REFLEX MICROSCOPIC
Bacteria, UA: NONE SEEN
Bilirubin Urine: NEGATIVE
Glucose, UA: NEGATIVE mg/dL
Ketones, ur: NEGATIVE mg/dL
Nitrite: NEGATIVE
Protein, ur: 30 mg/dL — AB
RBC / HPF: >50 RBC/hpf (ref 0–5)
Specific Gravity, Urine: 1.025 (ref 1.005–1.030)
pH: 7 (ref 5.0–8.0)

## 2024-07-22 LAB — CBC WITH DIFFERENTIAL/PLATELET
Abs Immature Granulocytes: 0.02 K/uL (ref 0.00–0.07)
Basophils Absolute: 0 K/uL (ref 0.0–0.1)
Basophils Relative: 0 %
Eosinophils Absolute: 0.2 K/uL (ref 0.0–0.5)
Eosinophils Relative: 3 %
HCT: 42.1 % (ref 39.0–52.0)
Hemoglobin: 14.2 g/dL (ref 13.0–17.0)
Immature Granulocytes: 0 %
Lymphocytes Relative: 34 %
Lymphs Abs: 2 K/uL (ref 0.7–4.0)
MCH: 32.4 pg (ref 26.0–34.0)
MCHC: 33.7 g/dL (ref 30.0–36.0)
MCV: 96.1 fL (ref 80.0–100.0)
Monocytes Absolute: 0.7 K/uL (ref 0.1–1.0)
Monocytes Relative: 12 %
Neutro Abs: 2.9 K/uL (ref 1.7–7.7)
Neutrophils Relative %: 51 %
Platelets: 431 K/uL — ABNORMAL HIGH (ref 150–400)
RBC: 4.38 MIL/uL (ref 4.22–5.81)
RDW: 13.4 % (ref 11.5–15.5)
WBC: 5.9 K/uL (ref 4.0–10.5)
nRBC: 0 % (ref 0.0–0.2)

## 2024-07-22 LAB — BASIC METABOLIC PANEL WITH GFR
Anion gap: 10 (ref 5–15)
BUN: 9 mg/dL (ref 6–20)
CO2: 25 mmol/L (ref 22–32)
Calcium: 9 mg/dL (ref 8.9–10.3)
Chloride: 106 mmol/L (ref 98–111)
Creatinine, Ser: 0.99 mg/dL (ref 0.61–1.24)
GFR, Estimated: >60 mL/min (ref >60)
Glucose, Bld: 101 mg/dL — ABNORMAL HIGH (ref 70–99)
Potassium: 4 mmol/L (ref 3.5–5.1)
Sodium: 141 mmol/L (ref 135–145)

## 2024-07-22 MED ORDER — CEPHALEXIN 250 MG PO CAPS
500.0000 mg | ORAL_CAPSULE | Freq: Once | ORAL | Status: AC
  Administered 2024-07-22: 500 mg via ORAL
  Filled 2024-07-22: qty 2

## 2024-07-22 MED ORDER — CEPHALEXIN 500 MG PO CAPS: 500.0000 mg | ORAL_CAPSULE | Freq: Three times a day (TID) | ORAL | 0 refills | Status: AC

## 2024-07-22 MED ORDER — SODIUM CHLORIDE 0.9 % IV BOLUS
1000.0000 mL | Freq: Once | INTRAVENOUS | Status: AC
  Administered 2024-07-22: 1000 mL via INTRAVENOUS

## 2024-07-22 NOTE — ED Provider Notes (Signed)
  EMERGENCY DEPARTMENT AT [facility] Provider Note   CSN: [id number] Arrival date & time: 07/22/24  9247     Patient presents with: Hematuria   Glen Morton is a 58 y.o. male.   Pt is a 58 yo male with pmhx significant for sciatica.  Pt noticed blood in his urine last night.  He came to the ED, but the WR was packed, so he went home and went to sleep.  He woke up this am and still had blood in his urine, so he came back.  No pain.  No trauma.       Prior to Admission medications   Medication Sig Start Date End Date Taking? Authorizing Provider  cephALEXin  (KEFLEX ) 500 MG capsule Take 1 capsule (500 mg total) by mouth 3 (three) times daily. 07/22/24  Yes Dean Clarity, MD    Allergies: Patient has no known allergies.    Review of Systems  Genitourinary:  Positive for hematuria.  All other systems reviewed and are negative.   Updated Vital Signs BP 132/80   Pulse 72   Temp 97.9 F (36.6 C)   Resp 14   Ht 6' 2 (1.88 m)   Wt 89.8 kg   SpO2 98%   BMI 25.42 kg/m   Physical Exam Vitals and nursing note reviewed.  Constitutional:      Appearance: Normal appearance.  HENT:     Head: Normocephalic and atraumatic.     Right Ear: External ear normal.     Left Ear: External ear normal.     Nose: Nose normal.     Mouth/Throat:     Mouth: Mucous membranes are moist.     Pharynx: Oropharynx is clear.  Eyes:     Extraocular Movements: Extraocular movements intact.     Conjunctiva/sclera: Conjunctivae normal.     Pupils: Pupils are equal, round, and reactive to light.  Cardiovascular:     Rate and Rhythm: Normal rate and regular rhythm.     Pulses: Normal pulses.     Heart sounds: Normal heart sounds.  Pulmonary:     Effort: Pulmonary effort is normal.     Breath sounds: Normal breath sounds.  Abdominal:     General: Abdomen is flat. Bowel sounds are normal.     Palpations: Abdomen is soft.  Musculoskeletal:        General: Normal range of  motion.     Cervical back: Normal range of motion and neck supple.  Skin:    General: Skin is warm.     Capillary Refill: Capillary refill takes less than 2 seconds.  Neurological:     General: No focal deficit present.     Mental Status: He is alert and oriented to person, place, and time.  Psychiatric:        Mood and Affect: Mood normal.        Behavior: Behavior normal.     (all labs ordered are listed, but only abnormal results are displayed) Labs Reviewed  URINALYSIS, ROUTINE W REFLEX MICROSCOPIC - Abnormal; Notable for the following components:      Result Value   APPearance HAZY (*)    Hgb urine dipstick MODERATE (*)    Protein, ur 30 (*)    Leukocytes,Ua SMALL (*)    All other components within normal limits  BASIC METABOLIC PANEL WITH GFR - Abnormal; Notable for the following components:   Glucose, Bld 101 (*)    All other components within normal limits  CBC WITH DIFFERENTIAL/PLATELET - Abnormal; Notable for the following components:   Platelets 431 (*)    All other components within normal limits  URINE CULTURE    EKG: None  Radiology: CT Renal Stone Study Result Date: 07/22/2024 CLINICAL DATA:  Abdominal/flank pain, stone suspected hematuria. EXAM: CT ABDOMEN AND PELVIS WITHOUT CONTRAST TECHNIQUE: Multidetector CT imaging of the abdomen and pelvis was performed following the standard protocol without IV contrast. RADIATION DOSE REDUCTION: This exam was performed according to the departmental dose-optimization program which includes automated exposure control, adjustment of the mA and/or kV according to patient size and/or use of iterative reconstruction technique. COMPARISON:  None Available. FINDINGS: Lower chest: The lung bases are clear. No pleural effusion. The heart is normal in size. No pericardial effusion. Hepatobiliary: The liver is normal in size. Non-cirrhotic configuration. No suspicious mass. No intrahepatic or extrahepatic bile duct dilation. No  calcified gallstones. Normal gallbladder wall thickness. No pericholecystic inflammatory changes. Pancreas: Unremarkable. No pancreatic ductal dilatation or surrounding inflammatory changes. Spleen: Within normal limits. No focal lesion. Adrenals/Urinary Tract: Adrenal glands are unremarkable. No suspicious renal mass within the limitations of this unenhanced exam. No nephroureterolithiasis or obstructive uropathy. Urinary bladder is under distended, precluding optimal assessment. However, no large mass or stones identified. No perivesical fat stranding. Stomach/Bowel: No disproportionate dilation of the small or large bowel loops. No evidence of abnormal bowel wall thickening or inflammatory changes. The appendix is unremarkable. There are scattered diverticula throughout the colon, without imaging signs of diverticulitis. Vascular/Lymphatic: No ascites or pneumoperitoneum. No abdominal or pelvic lymphadenopathy, by size criteria. No aneurysmal dilation of the major abdominal arteries. There are mild peripheral atherosclerotic vascular calcifications of the aorta and its major branches. Reproductive: Normal size prostate. Symmetric seminal vesicles. Other: There is a tiny fat containing umbilical hernia. The soft tissues and abdominal wall are otherwise unremarkable. Musculoskeletal: No suspicious osseous lesions. There are mild multilevel degenerative changes in the visualized spine. Intramedullary nail and fixation screw noted in the right proximal femur. IMPRESSION: 1. No nephroureterolithiasis or obstructive uropathy. No acute inflammatory process identified within the abdomen or pelvis. 2. Otherwise essentially unremarkable exam, as described above. Aortic Atherosclerosis (ICD10-I70.0). Electronically Signed   By: Ree Molt M.D.   On: 07/22/2024 10:11     Procedures   Medications Ordered in the ED  cephALEXin  (KEFLEX ) capsule 500 mg (has no administration in time range)  sodium chloride  0.9 %  bolus 1,000 mL (0 mLs Intravenous Stopped 07/22/24 1043)                                    Medical Decision Making Amount and/or Complexity of Data Reviewed Labs: ordered. Radiology: ordered.  Risk Prescription drug management.   This patient presents to the ED for concern of hematuria, this involves an extensive number of treatment options, and is a complaint that carries with it a high risk of complications and morbidity.  The differential diagnosis includes kidney stones, rcc,    Co morbidities that complicate the patient evaluation  sciatica   Additional history obtained:  Additional history obtained from epic chart review External records from outside source obtained and reviewed including wife   Lab Tests:  I Ordered, and personally interpreted labs.  The pertinent results include:  cbc nl, bmp nl, ua + hgb, ?50 rbcs, 21-50 wbcs   Imaging Studies ordered:  I ordered imaging studies including ct renal  I independently  visualized and interpreted imaging which showed  No nephroureterolithiasis or obstructive uropathy. No acute  inflammatory process identified within the abdomen or pelvis.  2. Otherwise essentially unremarkable exam, as described above.    Aortic Atherosclerosis (ICD10-I70.0).   I agree with the radiologist interpretation   Cardiac Monitoring:  The patient was maintained on a cardiac monitor.  I personally viewed and interpreted the cardiac monitored which showed an underlying rhythm of: nsr   Medicines ordered and prescription drug management:  I ordered medication including ivfs/keflex   for sx  Reevaluation of the patient after these medicines showed that the patient improved I have reviewed the patients home medicines and have made adjustments as needed   Test Considered:  ct   Problem List / ED Course:  Hematuria:  possible UTI.  Pt d/c with keflex . Pt referred to urology for cystoscopy.   Reevaluation:  After the  interventions noted above, I reevaluated the patient and found that they have :improved   Social Determinants of Health:  Lives at home   Dispostion:  After consideration of the diagnostic results and the patients response to treatment, I feel that the patent would benefit from discharge with outpatient f/u.       Final diagnoses:  Acute cystitis with hematuria    ED Discharge Orders          Ordered    cephALEXin  (KEFLEX ) 500 MG capsule  3 times daily        07/22/24 1052               Dean Clarity, MD 07/22/24 1057

## 2024-07-22 NOTE — ED Triage Notes (Signed)
 Pt arrived POV from home c/o hematuria that started this morning. Pt denies any pain.

## 2024-07-22 NOTE — Discharge Instructions (Addendum)
 It is very important to follow up with the urologist so they can look inside your bladder.

## 2024-07-23 LAB — URINE CULTURE: Culture: NO GROWTH
